# Patient Record
Sex: Female | Born: 1970 | Race: Black or African American | Hispanic: No | Marital: Single | Smoking: Never smoker
Health system: Southern US, Community
[De-identification: ages and names within clinical notes are randomized; demographics above are authoritative.]

## PROBLEM LIST (undated history)

## (undated) ENCOUNTER — Emergency Department: Admission: EM | Discharge status: Absent without leave | Payer: BC Managed Care – PPO

## (undated) DIAGNOSIS — I10 Essential (primary) hypertension: Secondary | ICD-10-CM

## (undated) DIAGNOSIS — R519 Headache, unspecified: Secondary | ICD-10-CM

## (undated) HISTORY — PX: SHOULDER ARTHROSCOPY DISTAL CLAVICLE EXCISION AND OPEN ROTATOR CUFF REPAIR: SHX2396

## (undated) HISTORY — PX: CHOLECYSTECTOMY: SHX55

---

## 2004-02-18 HISTORY — PX: TOTAL ABDOMINAL HYSTERECTOMY: SHX209

## 2011-05-17 ENCOUNTER — Ambulatory Visit: Payer: Self-pay | Admitting: Family Medicine

## 2012-04-01 ENCOUNTER — Emergency Department: Payer: Self-pay | Admitting: Emergency Medicine

## 2012-04-01 LAB — COMPREHENSIVE METABOLIC PANEL
Albumin: 3.4 g/dL (ref 3.4–5.0)
Anion Gap: 7 (ref 7–16)
BUN: 10 mg/dL (ref 7–18)
Bilirubin,Total: 0.5 mg/dL (ref 0.2–1.0)
Co2: 26 mmol/L (ref 21–32)
Creatinine: 0.83 mg/dL (ref 0.60–1.30)
EGFR (Non-African Amer.): >60
Glucose: 110 mg/dL — ABNORMAL HIGH (ref 65–99)
Osmolality: 272 (ref 275–301)
Potassium: 3.2 mmol/L — ABNORMAL LOW (ref 3.5–5.1)
SGOT(AST): 29 U/L (ref 15–37)
SGPT (ALT): 26 U/L (ref 12–78)
Total Protein: 7.7 g/dL (ref 6.4–8.2)

## 2012-04-01 LAB — CBC
MCHC: 33.6 g/dL (ref 32.0–36.0)
MCV: 86 fL (ref 80–100)
RBC: 4.29 10*6/uL (ref 3.80–5.20)
RDW: 13.7 % (ref 11.5–14.5)

## 2012-04-01 LAB — URINALYSIS, COMPLETE
Bilirubin,UR: NEGATIVE
Glucose,UR: NEGATIVE mg/dL (ref 0–75)
Ph: 5 (ref 4.5–8.0)
RBC,UR: 2 /HPF (ref 0–5)
Specific Gravity: 1.028 (ref 1.003–1.030)

## 2012-04-02 LAB — RAPID INFLUENZA A&B ANTIGENS

## 2012-04-04 LAB — BETA STREP CULTURE(ARMC)

## 2013-04-23 ENCOUNTER — Ambulatory Visit: Payer: Self-pay | Admitting: Family Medicine

## 2015-04-14 ENCOUNTER — Encounter: Payer: Self-pay | Admitting: Family Medicine

## 2015-04-14 ENCOUNTER — Ambulatory Visit (INDEPENDENT_AMBULATORY_CARE_PROVIDER_SITE_OTHER): Payer: Self-pay | Admitting: Family Medicine

## 2015-04-14 VITALS — BP 114/67 | HR 65 | Temp 98.5°F | Resp 16 | Ht 64.0 in | Wt 205.4 lb

## 2015-04-14 DIAGNOSIS — L309 Dermatitis, unspecified: Secondary | ICD-10-CM

## 2015-04-14 NOTE — Patient Instructions (Signed)
Cortaid or Cortizone-10 applied lightly twice a day.

## 2015-04-14 NOTE — Progress Notes (Signed)
Name: Caroline Wang   MRN: [id number]    DOB: 10/22/1970   Date:04/14/2015       Progress Note  Subjective  Chief Complaint  Chief Complaint  Patient presents with  . Rash    Pt noticed about a month ago, comes and goes, it is drying out with cracking and pain when she smiles    HPI Has a dry scaly place around mouth on and off for 1  Month.  Skin cracks and is only sore with smiling or eating.  No blisters or sores.  Just dry scaley skin.  No problem-specific assessment & plan notes found for this encounter.   History reviewed. No pertinent past medical history.  Social History  Substance Use Topics  . Smoking status: Never Smoker   . Smokeless tobacco: Not on file  . Alcohol Use: Not on file    No current outpatient prescriptions on file.  Not on File  Review of Systems  Constitutional: Negative for fever, chills, weight loss and malaise/fatigue.  HENT: Negative for hearing loss.   Eyes: Negative for blurred vision and double vision.  Respiratory: Negative for cough, shortness of breath and wheezing.   Cardiovascular: Negative for chest pain, palpitations and leg swelling.  Gastrointestinal: Negative for heartburn, abdominal pain and blood in stool.  Genitourinary: Negative for dysuria, urgency and frequency.  Skin: Positive for rash.  Neurological: Negative for weakness and headaches.      Objective  Filed Vitals:   04/14/15 1002  BP: 114/67  Pulse: 65  Temp: 98.5 F (36.9 C)  TempSrc: Oral  Resp: 16  Height: 5\' 4"  (1.626 m)  Weight: 205 lb 6.4 oz (93.169 kg)  SpO2: 100%     Physical Exam  Constitutional: She is well-developed, well-nourished, and in no distress. No distress.  Lymphadenopathy:    She has no cervical adenopathy.  Skin:  Slightly red raised scaly rash on upper and lower lips, L and R (L>R).  Does not innvolve lips themselves, just above and below them.  No blisters or cracked skin at this time.  Vitals reviewed.     No results  found for this or any previous visit (from the past 2160 hour(s)).   Assessment & Plan  1. Eczema -Use OTC Cortaid or Cortizone-10, applied lightly twice a day.  May use Vasoline to maintain moisture.

## 2015-09-14 ENCOUNTER — Other Ambulatory Visit: Payer: Self-pay | Admitting: Family Medicine

## 2015-09-15 ENCOUNTER — Other Ambulatory Visit: Payer: Self-pay | Admitting: Family Medicine

## 2016-04-07 ENCOUNTER — Ambulatory Visit (INDEPENDENT_AMBULATORY_CARE_PROVIDER_SITE_OTHER): Payer: BC Managed Care – PPO | Admitting: Family Medicine

## 2016-04-07 ENCOUNTER — Encounter: Payer: Self-pay | Admitting: Family Medicine

## 2016-04-07 VITALS — BP 136/82 | HR 98 | Temp 102.7°F | Resp 16 | Ht 64.0 in | Wt 220.0 lb

## 2016-04-07 DIAGNOSIS — R509 Fever, unspecified: Secondary | ICD-10-CM

## 2016-04-07 DIAGNOSIS — J111 Influenza due to unidentified influenza virus with other respiratory manifestations: Secondary | ICD-10-CM | POA: Diagnosis not present

## 2016-04-07 LAB — POCT INFLUENZA A/B
Influenza A, POC: NEGATIVE
Influenza B, POC: NEGATIVE

## 2016-04-07 MED ORDER — OSELTAMIVIR PHOSPHATE 75 MG PO CAPS: 75.0000 mg | ORAL_CAPSULE | Freq: Two times a day (BID) | ORAL | 0 refills | Status: DC

## 2016-04-07 MED ORDER — IPRATROPIUM BROMIDE 0.06 % NA SOLN: 2.0000 | Freq: Four times a day (QID) | NASAL | 0 refills | Status: DC

## 2016-04-07 MED ORDER — BENZONATATE 100 MG PO CAPS: 100.0000 mg | ORAL_CAPSULE | Freq: Three times a day (TID) | ORAL | 0 refills | Status: DC | PRN

## 2016-04-07 NOTE — Progress Notes (Signed)
Subjective:    Patient ID: Caroline Wang, female    DOB: June 21, 1970, 46 y.o.   MRN: [id number]  Caroline Wang is a 46 y.o. female presenting on 04/07/2016 for Fever (chills sore throat cough onset 4 days fever is 101.2 this morning)  Patient presents for a same day appointment.  HPI   INFLUENZA / Fever / Cough / URI Reports symptoms started 3 days at ago Monday night with significant headache, then developed fevers/chills, and felt general malaise, did not go to work, stayed at home and rested, drank fluids, tried to improve hydration. Then worsening over past 24-48 hours with worsening coughing, and some sore throat related to cough only, not sore with eating or drinking, cough is non productive. 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 Did not get flu vaccine this season. Has 2 children at home, ages 54 and 4, without symptoms currently. - Tried to take some OTC sinus medicine, then had rhinorrhea after, continues to take Mucinex-DM - Last dose of Tylenol last night 10pm - Admits now headache (frontal, still intermittent but eased up a lot, without any more sinus pressure - Admits fever (102.31F now, last night had 102.63F), has generalized muscle aches and fatigue - Denies any nausea, vomiting, abdominal pain, diarrhea  Social History  Substance Use Topics  . Smoking status: Never Smoker  . Smokeless tobacco: Never Used  . Alcohol use 0.0 oz/week    Review of Systems Per HPI unless specifically indicated above     Objective:    BP 136/82 (BP Location: Left Arm, Cuff Size: Normal)   Pulse 98   Temp (!) 102.7 F (39.3 C) (Oral)   Resp 16   Ht 5\' 4"  (1.626 m)   Wt 220 lb (99.8 kg)   SpO2 98%   BMI 37.76 kg/m   Wt Readings from Last 3 Encounters:  04/07/16 220 lb (99.8 kg)  04/14/15 205 lb 6.4 oz (93.2 kg)    Physical Exam  Constitutional: She is oriented to person, place, and time. She appears well-developed and  well-nourished. No distress.  Currently mildly ill appearing, mostly comfortable, cooperative  HENT:  Head: Normocephalic and atraumatic.  Mouth/Throat: Oropharynx is clear and moist.  Frontal sinuses mild tender. Maxillary sinuses non-tender. Nares patent with some congestion without purulence. Bilateral TMs clear without erythema or bulging, R TM mild clear effusion only. Oropharynx clear without erythema, exudates, edema or asymmetry.  Eyes: Conjunctivae are normal. Right eye exhibits no discharge. Left eye exhibits no discharge.  Neck: Normal range of motion. Neck supple.  Cardiovascular: Regular rhythm, normal heart sounds and intact distal pulses.   No murmur heard. Mild tachycardic  Pulmonary/Chest: Effort normal and breath sounds normal. No respiratory distress. She has no wheezes. She has no rales.  Speaks full sentences. Good air movement. Occasional cough.  Musculoskeletal: Normal range of motion. She exhibits no edema or tenderness.  Lymphadenopathy:    She has no cervical adenopathy.  Neurological: She is alert and oriented to person, place, and time.  Skin: Skin is warm and dry. No rash noted. She is not diaphoretic. No erythema.  Psychiatric: Her behavior is normal.  Nursing note and vitals reviewed.   I have personally reviewed the following lab results from 04/07/16.  Results for orders placed or performed in visit on 04/07/16  POCT Influenza A/B  Result Value Ref Range   Influenza A, POC Negative Negative   Influenza B, POC Negative  Negative      Assessment & Plan:   Problem List Items Addressed This Visit    None    Visit Diagnoses    Influenza    -  Primary  Clinically diagnosed influenza despite negative rapid flu test today, concern for flu given symptoms and actively febrile 102.49F currently - Duration x 3 days, without complication. Tolerating PO and well hydrated - No other focal findings of infection today - Did not receive influenza vaccine this  season  Plan: 1. Start Tamiflu 75mg  capsules BID x 5 days, also provided additional rx as precaution to use for one of patient's children if they develop symptoms, age 67-25, otherwise they can go to urgent care 2. Supportive care as advised with NSAID / Tylenol alternating PRN fever/myalgias, improve hydration, may take OTC Cold/Flu meds 3. Start Tessalon Perls take 1 capsule up to 3 times a day as needed for cough 4. Start Atrovent nasal spray decongestant 2 sprays in each nostril up to 4 times daily for 7 days 5. Return criteria given if significant worsening, consider post-influenza complications, otherwise follow-up if needed      Relevant Medications   benzonatate (TESSALON) 100 MG capsule   ipratropium (ATROVENT) 0.06 % nasal spray   oseltamivir (TAMIFLU) 75 MG capsule   Fever chills       Relevant Orders   POCT Influenza A/B (Completed)      Meds ordered this encounter  Medications  . benzonatate (TESSALON) 100 MG capsule    Sig: Take 1 capsule (100 mg total) by mouth 3 (three) times daily as needed for cough.    Dispense:  30 capsule    Refill:  0  . DISCONTD: oseltamivir (TAMIFLU) 75 MG capsule    Sig: Take 1 capsule (75 mg total) by mouth 2 (two) times daily. For 5 days    Dispense:  10 capsule    Refill:  0  . ipratropium (ATROVENT) 0.06 % nasal spray    Sig: Place 2 sprays into both nostrils 4 (four) times daily. For up to 5-7 days then stop.    Dispense:  15 mL    Refill:  0  . oseltamivir (TAMIFLU) 75 MG capsule    Sig: Take 1 capsule (75 mg total) by mouth 2 (two) times daily. For 5 days    Dispense:  10 capsule    Refill:  0    Follow up plan: Return in about 2 weeks (around 04/21/2016), or if symptoms worsen or fail to improve, for flu.  Saralyn PilarAlexander Saquan Furtick, DO [facility] 04/07/2016, 8:59 AM

## 2016-04-07 NOTE — Patient Instructions (Signed)
Thank you for coming in to clinic today.  Your flu test was NEGATIVE, this is not 100% though, and you can still have the flu with a negative test, otherwise it could be a different viral syndrome.  1. - Start Tamiflu (anti-flu medicine) take one capsule 75mg  twice a day for 5 days - I have printed one additional rx for a family member to start if they get symptoms start 1 capsule twice a day for 5 days - Wash hands and cover cough very well to avoid spread of infection - For symptom control:      - Take Ibuprofen / Advil 400-600mg  every 6-8 hours as needed for fever / muscle aches, and may also take Tylenol 500-1000mg  per dose every 6-8 hours or 3 times a day, can alternate dosing every 3 hours      - Start Tessalon perls one every 8 hours or 3 times a day as needed for cough      - Start Atrovent nasal spray decongestant 2 sprays in each nostril up to 4 times daily for 7 days      - Start OTC Mucinex-DM for cough and congestion for up to 7 days - Improve hydration with plenty of clear fluids  If significant worsening with poor fluid intake, worsening fever, difficulty breathing due to coughing, worsening body aches, weakness, or other more concerning symptoms difficulty breathing you can seek treatment at Emergency Department. Also if improved flu symptoms and then worsening days to week later with concerns for bronchitis, productive cough fever chills again we may need to check for possible pneumonia that can occur after the flu  Please schedule a follow-up appointment with Dr. Althea CharonKaramalegos in 1-2 weeks as needed if worsening from Flu / Bronchitis  If you have any other questions or concerns, please feel free to call the clinic or send a message through MyChart. You may also schedule an earlier appointment if necessary.  Saralyn PilarAlexander Amatullah Christy, DO [facility]

## 2016-06-10 ENCOUNTER — Ambulatory Visit (INDEPENDENT_AMBULATORY_CARE_PROVIDER_SITE_OTHER): Payer: BC Managed Care – PPO | Admitting: Family Medicine

## 2016-06-10 ENCOUNTER — Encounter: Payer: Self-pay | Admitting: Family Medicine

## 2016-06-10 VITALS — BP 131/55 | HR 67 | Temp 98.6°F | Resp 16 | Ht 64.0 in | Wt 218.0 lb

## 2016-06-10 DIAGNOSIS — J309 Allergic rhinitis, unspecified: Secondary | ICD-10-CM | POA: Diagnosis not present

## 2016-06-10 DIAGNOSIS — Z9109 Other allergy status, other than to drugs and biological substances: Secondary | ICD-10-CM

## 2016-06-10 MED ORDER — IPRATROPIUM BROMIDE 0.06 % NA SOLN: 2.0000 | Freq: Four times a day (QID) | NASAL | 0 refills | Status: DC

## 2016-06-10 NOTE — Progress Notes (Signed)
Subjective:    Patient ID: Caroline Wang, female    DOB: 08/22/1970, 46 y.o.   MRN: [id number]  Caroline Wang is a 46 y.o. 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 Has had some temporary relief, but usually refractory - Interested in referral to Allergist for testing and treatment - Has family history of allergies / sinus as well with mother - Denies any fevers/chills, sinus pain or pressure, ear pain or pressure, wheezing, dyspnea  Social History  Substance Use Topics  . Smoking status: Never Smoker  . Smokeless tobacco: Never Used  . Alcohol use 0.0 oz/week    Review of Systems Per HPI unless specifically indicated above     Objective:    BP (!) 131/55   Pulse 67   Temp 98.6 F (37 C) (Oral)   Resp 16   Ht 5\' 4"  (1.626 m)   Wt 218 lb (98.9 kg)   BMI 37.42 kg/m   Wt Readings from Last 3 Encounters:  06/10/16 218 lb (98.9 kg)  04/07/16 220 lb (99.8 kg)  04/14/15 205 lb 6.4 oz (93.2 kg)    Physical Exam  Constitutional: She appears well-developed and well-nourished. No distress.  Well-appearing, uncomfortable, cooperative  HENT:  Head: Normocephalic and atraumatic.  Mouth/Throat: Oropharynx is clear and moist.  Frontal / maxillary sinuses non-tender. Nares with significant bilateral R>L turbinate edema and inflammation with  some congestion without purulence. Bilateral TMs normal with mild clear effusion R>L without erythema or bulging. Oropharynx clear without erythema, exudates, edema or asymmetry.  Eyes: Conjunctivae are normal. Right eye exhibits no discharge. Left eye exhibits no discharge.  Neck: Normal range of motion. Neck supple.  Cardiovascular: Normal rate, regular rhythm, normal heart sounds and intact distal pulses.   No murmur heard. Pulmonary/Chest: Effort normal and breath sounds normal. No respiratory distress. She has no wheezes. She has no rales.  Lymphadenopathy:    She has no cervical adenopathy.  Neurological: She is alert.  Skin: Skin is warm and dry. No rash noted. She is not diaphoretic. No erythema.  Psychiatric: She has a normal mood and affect. Her behavior is normal.  Nursing note and vitals reviewed.        Assessment & Plan:   Problem List Items Addressed This Visit    Chronic allergic rhinitis - Primary    Acute on chronic allergic rhinitis with moderate to severe environmental allergies. Without evidence of active sinusitis or other infection - Refractory to most treatments (anti-histamines, nasal steroid, among others, singulair)  Plan: 1. Referral to [facility] - further eval, allergy testing, management, may need allergy shots in future 2. No antibiotics at this time, no sinusitis 3. Restart Atrovent nasal spray decongestant 2 sprays in each nostril up to 4 times daily for 7 days 4. May continue anti-histamine 5. Follow-up PRN worsening, - limited other options available at this time      Relevant Medications  ipratropium (ATROVENT) 0.06 % nasal spray   Other Relevant Orders   Ambulatory referral to Allergy    Other Visit Diagnoses    Environmental allergies       Likely underlying etiology, possible other exposures at work   Relevant Orders   Ambulatory referral to Allergy      Meds ordered this encounter  Medications  . ipratropium (ATROVENT) 0.06 %  nasal spray    Sig: Place 2 sprays into both nostrils 4 (four) times daily. For up to 5-7 days then stop.    Dispense:  15 mL    Refill:  0    Follow up plan: Return in about 4 weeks (around 07/08/2016), or if symptoms worsen or fail to improve, for allergy/sinus.  Saralyn Pilar, DO [facility] 06/10/2016, 10:56 PM

## 2016-06-10 NOTE — Assessment & Plan Note (Signed)
Acute on chronic allergic rhinitis with moderate to severe environmental allergies. Without evidence of active sinusitis or other infection - Refractory to most treatments (anti-histamines, nasal steroid, among others, singulair)  Plan: 1. Referral to [facility] - further eval, allergy testing, management, may need allergy shots in future 2. No antibiotics at this time, no sinusitis 3. Restart Atrovent nasal spray decongestant 2 sprays in each nostril up to 4 times daily for 7 days 4. May continue anti-histamine 5. Follow-up PRN worsening, - limited other options available at this time

## 2016-06-10 NOTE — Patient Instructions (Signed)
Thank you for coming to the clinic today.  1.  Placed referral, call them within 1-2 weeks if you have not heard back about apt yet  [facility] [address] [place], [place]  [id number] Phone: [phone]  Sent rx for the Atrovent again, nasal spray decongestant 2 sprays in each nostril up to 4 times daily for 7 days  STOP Afrin  Keep taking sudafed  May try OTC anti-histamine claritin, zyrtec, allegra - but keep in mind may need to come off of these for allergy testing  Please schedule a follow-up appointment with Dr. Althea CharonKaramalegos as needed for allergies / sinusitis, or if changes into infection, fevers, or worsening sinus / pain pressure  If you have any other questions or concerns, please feel free to call the clinic or send a message through MyChart. You may also schedule an earlier appointment if necessary.  Saralyn PilarAlexander Lysa Livengood, DO [facility]

## 2017-05-02 ENCOUNTER — Other Ambulatory Visit: Payer: Self-pay

## 2017-05-02 ENCOUNTER — Encounter: Payer: Self-pay | Admitting: Family Medicine

## 2017-05-02 ENCOUNTER — Ambulatory Visit (INDEPENDENT_AMBULATORY_CARE_PROVIDER_SITE_OTHER): Payer: BC Managed Care – PPO | Admitting: Family Medicine

## 2017-05-02 DIAGNOSIS — J309 Allergic rhinitis, unspecified: Secondary | ICD-10-CM

## 2017-05-02 MED ORDER — FEXOFENADINE HCL 180 MG PO TABS: 180.0000 mg | ORAL_TABLET | Freq: Every day | ORAL | 5 refills | Status: DC

## 2017-05-02 MED ORDER — IPRATROPIUM BROMIDE 0.06 % NA SOLN: 2.0000 | Freq: Four times a day (QID) | NASAL | 0 refills | Status: DC

## 2017-05-02 MED ORDER — MONTELUKAST SODIUM 10 MG PO TABS: 10.0000 mg | ORAL_TABLET | Freq: Every day | ORAL | 1 refills | Status: DC

## 2017-05-02 NOTE — Patient Instructions (Addendum)
Thank you for coming to the office today.  1. It sounds like you have persistent Sinus Congestion or "Rhinosinusitis" - I do not think that this is a Bacterial Sinus Infection. Usually these are caused by Viruses or Allergies, and will run it's course in about 7 to 10 days. - No antibiotics are needed - Resume Allegra 180mg  daily  - Start Atrovent nasal spray decongestant 2 sprays in each nostril up to 4 times daily for 7 days  - Start Singulair 10mg  nightly  - Recommend to keep using Nasal Saline (Simply saline) spray multiple times a day to help flush out congestion and clear sinuses  Do not use Afrin  Lavage system or netti pot is fine to use  - Improve hydration by drinking plenty of clear fluids (water, gatorade) to reduce secretions and thin congestion - Congestion draining down throat can cause irritation. May try warm herbal tea with honey, cough drops - Can take Tylenol or Ibuprofen as needed for fevers - May continue over the counter cold medicine as you are, I would not use any decongestant or mucinex longer than 7 days.  If not improving by Friday call or send message and we can send in antibiotic if worse  If you develop persistent fever >101F for at least 3 consecutive days, headaches with sinus pain or pressure or persistent earache, please schedule a follow-up evaluation within next few days to week.  DUE for FASTING BLOOD WORK (no food or drink after midnight before the lab appointment, only water or coffee without cream/sugar on the morning of)  SCHEDULE "Lab Only" visit in the morning at the clinic for lab draw in 3-6 MONTHS   - Make sure Lab Only appointment is at about 1 week before your next appointment, so that results will be available  For Lab Results, once available within 2-3 days of blood draw, you can can log in to MyChart online to view your results and a brief explanation. Also, we can discuss results at next follow-up visit.   Please schedule a  Follow-up Appointment to: Return in about 4 months (around 09/01/2017), or if symptoms worsen or fail to improve, for fasting lab only in 4 months then 1 week later Annual + Pap.  If you have any other questions or concerns, please feel free to call the office or send a message through MyChart. You may also schedule an earlier appointment if necessary.  Additionally, you may be receiving a survey about your experience at our office within a few days to 1 week by e-mail or mail. We value your feedback.  Saralyn PilarAlexander Delwin Raczkowski, DO [facility]

## 2017-05-02 NOTE — Progress Notes (Signed)
Subjective:    Patient ID: Caroline Wang, female    DOB: 08/08/1970, 47 y.o.   MRN: [id number]  Caroline Amenamela R Quezada is a 47 y.o. female presenting on 05/02/2017 for Cough (worsening over 3 days)  Patient presents for a same day appointment.  HPI   Acute URI / Allergic Rhinosinusitis Reports symptoms started 3 days ago with rhinorrhea and sinus congestion, then seemed to progress with persistent worsening congestion and sinus symptoms. Seems similar to prior allergy flare ups. Lastseen by me for similar issue 05/2016 with prior rx and referral to Allergist, they offered allergy shots but she declined due to frequency of vaccines and uncertain results, and not interested in vaccines - She took Xyzal (allergy med) and OTC generic phenylephrine decongestant - Now today got elevated temp to 100.44F today at 2pm - Previously tried flonase, anti histamine nasal spray, atrovent, uncertain exact results - Denies chills chest pain dyspnea wheezing headache rash ear pain   Health Maintenance: Due for pap smear, last done >3 years ago, unsure date. She will schedule for annual, see below  [facility] 05/02/2017  Decreased Interest 0  Down, Depressed, Hopeless 0  PHQ - 2 Score 0  Altered sleeping 0  Tired, decreased energy 0  Change in appetite 0  Feeling bad or failure about yourself  0  Trouble concentrating 0  Moving slowly or fidgety/restless 0  Suicidal thoughts 0  PHQ-9 Score 0  Difficult doing work/chores Not difficult at all    Social History   Tobacco Use  . Smoking status: Never Smoker  . Smokeless tobacco: Never Used  Substance Use Topics  . Alcohol use: Yes    Alcohol/week: 0.0 oz  . Drug use: No    Review of Systems Per HPI unless specifically indicated above     Objective:    BP 135/79 (BP Location: Left Arm, Patient Position: Sitting, Cuff Size: Normal)   Pulse 95   Temp 100.1 F (37.8 C)   Resp 16   Ht 5\' 4"  (1.626 m)   Wt 216 lb (98 kg)   BMI  37.08 kg/m   Wt Readings from Last 3 Encounters:  05/02/17 216 lb (98 kg)  06/10/16 218 lb (98.9 kg)  04/07/16 220 lb (99.8 kg)    Physical Exam  Constitutional: She is oriented to person, place, and time. She appears well-developed and well-nourished. No distress.  Tired appearing, comfortable, cooperative, obese  HENT:  Head: Normocephalic and atraumatic.  Mouth/Throat: Oropharynx is clear and moist.  Frontal / maxillary sinuses non-tender. Nares with turbinate edema extensive without purulence. R TM with notable clear effusion and fullness, without purulence or erythema or bulging. L TM clear without erythema, effusion or bulging. Oropharynx with mild posterior pharyngeal drainage without erythema, exudates, edema or asymmetry.  Eyes: Conjunctivae are normal. Right eye exhibits no discharge. Left eye exhibits no discharge.  Neck: Normal range of motion. Neck supple.  Cardiovascular: Normal rate, regular rhythm, normal heart sounds and intact distal pulses.  No murmur heard. Pulmonary/Chest: Effort normal and breath sounds normal. No respiratory distress. She has no wheezes. She has no rales.  Musculoskeletal: Normal range of motion. She exhibits no edema.  Lymphadenopathy:    She has no cervical adenopathy.  Neurological: She is alert and oriented to person, place, and time.  Skin: Skin is warm and dry. No rash noted. She is not diaphoretic. No erythema.  Psychiatric: Her behavior is normal.  Well groomed, good eye contact, normal speech  and thoughts  Nursing note and vitals reviewed.      Assessment & Plan:   Problem List Items Addressed This Visit    Chronic allergic rhinitis    Acute on chronic allergic rhinitis with moderate to severe environmental allergies. Without evidence of active sinusitis or other infection - Refractory to most treatments in past (anti-histamines, nasal steroid, among others, singulair) Already seen [facility] [place] [facility] but they have nothing else to  offer since she has declined allergy shots  Plan: 1. Discussion on limited options - agree to repeat trial of allergy meds to see if helps some, recommend that she should start medicines in advance of allergy season instead of waiting until symptoms worse - Start anti histamine Allegra 180mg  daily AM, new rx Singulair 10mg  nightly - Start Atrovent nasal spray decongestant 2 sprays in each nostril up to 4 times daily for 7 days, short term relief, she is not as interested in flonase or antihistamine spray - Use nasal saline - No antibiotics at this time, no sinusitis if not improved by 3 days or by next week can contact office consider augmentin Follow-up PRN worsening, - limited other options available at this time      Relevant Medications   fexofenadine (ALLEGRA) 180 MG tablet   montelukast (SINGULAIR) 10 MG tablet   ipratropium (ATROVENT) 0.06 % nasal spray        Meds ordered this encounter  Medications  . fexofenadine (ALLEGRA) 180 MG tablet    Sig: Take 1 tablet (180 mg total) by mouth daily.    Dispense:  30 tablet    Refill:  5  . montelukast (SINGULAIR) 10 MG tablet    Sig: Take 1 tablet (10 mg total) by mouth at bedtime.    Dispense:  90 tablet    Refill:  1  . ipratropium (ATROVENT) 0.06 % nasal spray    Sig: Place 2 sprays into both nostrils 4 (four) times daily. For up to 5-7 days then stop.    Dispense:  15 mL    Refill:  0      Follow up plan: Return in about 4 months (around 09/01/2017), or if symptoms worsen or fail to improve, for fasting lab only in 4 months then 1 week later Annual + Pap.  Future labs ordered for 08/24/17  Saralyn Pilar, DO [facility] 05/03/2017, 1:39 AM

## 2017-05-03 ENCOUNTER — Other Ambulatory Visit: Payer: Self-pay | Admitting: Family Medicine

## 2017-05-03 DIAGNOSIS — Z0000 Encounter for general adult medical examination without abnormal findings: Secondary | ICD-10-CM

## 2017-05-03 DIAGNOSIS — Z131 Encounter for screening for diabetes mellitus: Secondary | ICD-10-CM

## 2017-05-03 DIAGNOSIS — E669 Obesity, unspecified: Secondary | ICD-10-CM | POA: Insufficient documentation

## 2017-05-03 DIAGNOSIS — Z13220 Encounter for screening for lipoid disorders: Secondary | ICD-10-CM

## 2017-05-03 NOTE — Assessment & Plan Note (Signed)
Acute on chronic allergic rhinitis with moderate to severe environmental allergies. Without evidence of active sinusitis or other infection - Refractory to most treatments in past (anti-histamines, nasal steroid, among others, singulair) Already seen [facility] but they have nothing else to offer since she has declined allergy shots  Plan: 1. Discussion on limited options - agree to repeat trial of allergy meds to see if helps some, recommend that she should start medicines in advance of allergy season instead of waiting until symptoms worse - Start anti histamine Allegra 180mg  daily AM, new rx Singulair 10mg  nightly - Start Atrovent nasal spray decongestant 2 sprays in each nostril up to 4 times daily for 7 days, short term relief, she is not as interested in flonase or antihistamine spray - Use nasal saline - No antibiotics at this time, no sinusitis if not improved by 3 days or by next week can contact office consider augmentin Follow-up PRN worsening, - limited other options available at this time

## 2017-06-26 LAB — RPR: RPR: NONREACTIVE

## 2017-06-26 LAB — HM HIV SCREENING LAB: HM HIV Screening: NEGATIVE

## 2017-06-26 LAB — HM HEPATITIS C SCREENING LAB: HM Hepatitis Screen: NEGATIVE

## 2017-06-30 ENCOUNTER — Other Ambulatory Visit: Payer: Self-pay | Admitting: Family Medicine

## 2017-06-30 DIAGNOSIS — Z1231 Encounter for screening mammogram for malignant neoplasm of breast: Secondary | ICD-10-CM

## 2017-07-05 ENCOUNTER — Ambulatory Visit
Admission: RE | Admit: 2017-07-05 | Discharge: 2017-07-05 | Disposition: A | Discharge status: Home / Self Care | Payer: BC Managed Care – PPO | Source: Ambulatory Visit | Attending: Family Medicine | Admitting: Family Medicine

## 2017-07-05 ENCOUNTER — Encounter: Payer: Self-pay | Admitting: Radiology

## 2017-07-05 DIAGNOSIS — Z1231 Encounter for screening mammogram for malignant neoplasm of breast: Secondary | ICD-10-CM

## 2017-07-13 ENCOUNTER — Encounter: Payer: Self-pay | Admitting: Family Medicine

## 2017-08-24 ENCOUNTER — Other Ambulatory Visit: Payer: BC Managed Care – PPO

## 2017-08-24 DIAGNOSIS — Z13220 Encounter for screening for lipoid disorders: Secondary | ICD-10-CM

## 2017-08-24 DIAGNOSIS — Z0000 Encounter for general adult medical examination without abnormal findings: Secondary | ICD-10-CM

## 2017-08-24 DIAGNOSIS — E669 Obesity, unspecified: Secondary | ICD-10-CM

## 2017-08-24 DIAGNOSIS — Z131 Encounter for screening for diabetes mellitus: Secondary | ICD-10-CM

## 2017-08-24 LAB — COMPLETE METABOLIC PANEL WITH GFR
AG RATIO: 1.5 (calc) (ref 1.0–2.5)
ALBUMIN MSPROF: 4.1 g/dL (ref 3.6–5.1)
ALT: 9 U/L (ref 6–29)
AST: 16 U/L (ref 10–35)
Alkaline phosphatase (APISO): 66 U/L (ref 33–115)
BUN: 12 mg/dL (ref 7–25)
CALCIUM: 9.1 mg/dL (ref 8.6–10.2)
CO2: 26 mmol/L (ref 20–32)
Chloride: 103 mmol/L (ref 98–110)
Creat: 0.81 mg/dL (ref 0.50–1.10)
GFR, EST NON AFRICAN AMERICAN: 87 mL/min/{1.73_m2} (ref >=60)
GFR, Est African American: 101 mL/min/{1.73_m2} (ref >=60)
GLOBULIN: 2.8 g/dL (ref 1.9–3.7)
Glucose, Bld: 100 mg/dL — ABNORMAL HIGH (ref 65–99)
POTASSIUM: 4.1 mmol/L (ref 3.5–5.3)
SODIUM: 138 mmol/L (ref 135–146)
Total Bilirubin: 0.4 mg/dL (ref 0.2–1.2)
Total Protein: 6.9 g/dL (ref 6.1–8.1)

## 2017-08-24 LAB — CBC WITH DIFFERENTIAL/PLATELET
Basophils Absolute: 52 cells/uL (ref 0–200)
Basophils Relative: 0.7 %
EOS ABS: 192 {cells}/uL (ref 15–500)
Eosinophils Relative: 2.6 %
HCT: 36.5 % (ref 35.0–45.0)
Hemoglobin: 12 g/dL (ref 11.7–15.5)
Lymphs Abs: 2546 cells/uL (ref 850–3900)
MCH: 29.2 pg (ref 27.0–33.0)
MCHC: 32.9 g/dL (ref 32.0–36.0)
MCV: 88.8 fL (ref 80.0–100.0)
MONOS PCT: 8 %
MPV: 10.1 fL (ref 7.5–12.5)
NEUTROS PCT: 54.3 %
Neutro Abs: 4018 cells/uL (ref 1500–7800)
PLATELETS: 393 10*3/uL (ref 140–400)
RBC: 4.11 10*6/uL (ref 3.80–5.10)
RDW: 12.8 % (ref 11.0–15.0)
TOTAL LYMPHOCYTE: 34.4 %
WBC: 7.4 10*3/uL (ref 3.8–10.8)
WBCMIX: 592 {cells}/uL (ref 200–950)

## 2017-08-24 LAB — LIPID PANEL
Cholesterol: 146 mg/dL (ref <200)
HDL: 70 mg/dL (ref >50)
LDL Cholesterol (Calc): 62 mg/dL (calc)
Non-HDL Cholesterol (Calc): 76 mg/dL (calc) (ref <130)
Total CHOL/HDL Ratio: 2.1 (calc) (ref <5.0)
Triglycerides: 61 mg/dL (ref <150)

## 2017-08-24 LAB — HEMOGLOBIN A1C
HEMOGLOBIN A1C: 5.7 %{Hb} — AB (ref <5.7)
Mean Plasma Glucose: 117 (calc)
eAG (mmol/L): 6.5 (calc)

## 2017-08-30 ENCOUNTER — Encounter: Payer: Self-pay | Admitting: Family Medicine

## 2017-08-30 ENCOUNTER — Ambulatory Visit (INDEPENDENT_AMBULATORY_CARE_PROVIDER_SITE_OTHER): Payer: BC Managed Care – PPO | Admitting: Family Medicine

## 2017-08-30 VITALS — BP 116/68 | HR 60 | Temp 98.8°F | Resp 16 | Ht 64.0 in | Wt 212.0 lb

## 2017-08-30 DIAGNOSIS — B372 Candidiasis of skin and nail: Secondary | ICD-10-CM

## 2017-08-30 DIAGNOSIS — Z0000 Encounter for general adult medical examination without abnormal findings: Secondary | ICD-10-CM | POA: Diagnosis not present

## 2017-08-30 DIAGNOSIS — IMO0002 Reserved for concepts with insufficient information to code with codable children: Secondary | ICD-10-CM | POA: Insufficient documentation

## 2017-08-30 DIAGNOSIS — R7309 Other abnormal glucose: Secondary | ICD-10-CM | POA: Diagnosis not present

## 2017-08-30 DIAGNOSIS — G43709 Chronic migraine without aura, not intractable, without status migrainosus: Secondary | ICD-10-CM | POA: Insufficient documentation

## 2017-08-30 DIAGNOSIS — E669 Obesity, unspecified: Secondary | ICD-10-CM

## 2017-08-30 DIAGNOSIS — J309 Allergic rhinitis, unspecified: Secondary | ICD-10-CM | POA: Diagnosis not present

## 2017-08-30 MED ORDER — CLOTRIMAZOLE-BETAMETHASONE 1-0.05 % EX CREA: TOPICAL_CREAM | CUTANEOUS | 0 refills | Status: DC

## 2017-08-30 NOTE — Assessment & Plan Note (Signed)
Stable without active flare, still has persistent symptoms, limited improve on Singulair Followed by [facility] in past May reuse Atrovent PRN use in future Return if sinusitis

## 2017-08-30 NOTE — Assessment & Plan Note (Signed)
Weight down 4 lbs in 4 months Encourage to improve regular diet as discussed for elevated A1c. Keep improving regular exercise Goal weight loss progress

## 2017-08-30 NOTE — Progress Notes (Signed)
Subjective:    Patient ID: Caroline Wang, female    DOB: 03/03/1970, 47 y.o.   MRN: [id number]  Caroline Wang is a 47 y.o. female presenting on 08/30/2017 for Annual Exam   HPI   Here for Annual Physical and Lab Review  Elevated A1c / Obesity BMI >36 Recent labs show normal Lipids. - No prior history of A1c or PreDM. She does not have significant fam history of DM Meds: never on med before Lifestyle: - Weight down 4 lbs in 4 months - Diet (Tries to limit fried, not following a particular diet, sometimes missing breakfast due to time, usually 2 meals a day) - Exercise (3-4 x weekly about 1-2 hours a day, mostly zumba classes with cardio and whole body exercise, does not do strength training) Denies hypoglycemia  Chronic Migraines Reports long term problem for past 25 years. She has been treated in past with Wellbutrin, Topamax (side effects), off all meds for while now just takes Excedrin migraine. Frequency has 1-2 x migraine monthly. Usually excedrin helps if takes before onset. Admits photosensitivity, no nausea or vomiting or other complicated symptoms. Unsure trigger.  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 Improved temporarily on Atrovent only used briefly can re-use again.  Left breast under skin / Intertrigo - Reports recent problem over past 1-2 weeks, with irritated skin under breast, has been red and irritated episodic, itchy symptoms. Not used treatment, worse with warm weather sweat and moisture.  Health Maintenance:  Due for Flu Shot - declines today despite discussion on benefit.  Cervical Cancer Screening: S/p hysterectomy approx 10 years ago, uncertain if total and if has cervix still, had prior pap smear by last PCP Dr Juanetta GoslingHawkins several years ago, she was told "did not need another pap smear" after her surgery. Will check records. No prior abnormal result.  Breast  CA Screening: UTD. Last mammogram result negative bi-rads 1 (07/05/17) [facility]. No prior history abnormal mammogram. No known family history of breast cancer. Currently asymptomatic.  Colon CA Screening: Never had colonoscopy. Now at age 47 not due for early screening but could be eligible based on guidelines for age 59>45. No known family history of colon CA.   [facility] 05/02/2017  Decreased Interest 0  Down, Depressed, Hopeless 0  PHQ - 2 Score 0  Altered sleeping 0  Tired, decreased energy 0  Change in appetite 0  Feeling bad or failure about yourself  0  Trouble concentrating 0  Moving slowly or fidgety/restless 0  Suicidal thoughts 0  PHQ-9 Score 0  Difficult doing work/chores Not difficult at all    History reviewed. No pertinent past medical history. Past Surgical History:  Procedure Laterality Date  . TOTAL ABDOMINAL HYSTERECTOMY  02/2004   Total Abdominal including ovaries, cervix [facility] Dr Talbert CageWhite Jr   Social History   Socioeconomic History  . Marital status: Single    Spouse name: Not on file  . Number of children: 2  . Years of education: Not on file  . Highest education level: Not on file  Occupational History  . Not on file  Social Needs  . Financial resource strain: Not on file  . Food insecurity:    Worry: Not on file    Inability: Not on file  . Transportation needs:    Medical: Not on file    Non-medical: Not on file  Tobacco Use  . Smoking status: Never Smoker  . Smokeless tobacco: Never Used  Substance and Sexual Activity  . Alcohol use: Yes    Alcohol/week: 0.0 standard drinks  . Drug use: No  . Sexual activity: Not on file  Lifestyle  . Physical activity:    Days per week: Not on file    Minutes per session: Not on file  . Stress: Not on file  Relationships  . Social connections:    Talks on phone: Not on file    Gets together: Not on file    Attends religious service: Not on file      Active member of club or organization: Not on file    Attends meetings of clubs or organizations: Not on file    Relationship status: Not on file  . Intimate partner violence:    Fear of current or ex partner: Not on file    Emotionally abused: Not on file    Physically abused: Not on file    Forced sexual activity: Not on file  Other Topics Concern  . Not on file  Social History Narrative  . Not on file   Family History  Problem Relation Age of Onset  . Other Maternal Aunt 1       Pre-Diabetes  . Breast cancer Neg Hx   . Diabetes Neg Hx   . Colon cancer Neg Hx    Current Outpatient Medications on File Prior to Visit  Medication Sig  . fexofenadine (ALLEGRA) 180 MG tablet Take 1 tablet (180 mg total) by mouth daily.  Marland Kitchen ipratropium (ATROVENT) 0.06 % nasal spray Place 2 sprays into both nostrils 4 (four) times daily. For up to 5-7 days then stop.  . montelukast (SINGULAIR) 10 MG tablet Take 1 tablet (10 mg total) by mouth at bedtime.   No current facility-administered medications on file prior to visit.     Review of Systems  Constitutional: Negative for activity change, appetite change, chills, diaphoresis, fatigue and fever.  HENT: Positive for congestion. Negative for hearing loss.   Eyes: Negative for visual disturbance.  Respiratory: Negative for apnea, cough, choking, chest tightness, shortness of breath and wheezing.   Cardiovascular: Negative for chest pain, palpitations and leg swelling.  Gastrointestinal: Negative for abdominal pain, anal bleeding, blood in stool, constipation, diarrhea, nausea and vomiting.  Endocrine: Negative for cold intolerance.  Genitourinary: Negative for decreased urine volume, difficulty urinating, dysuria, frequency, hematuria, vaginal bleeding, vaginal discharge and vaginal pain.  Musculoskeletal: Negative for arthralgias, back pain and neck pain.  Skin: Positive for rash (under left breast).  Allergic/Immunologic: Negative for  environmental allergies.  Neurological: Negative for dizziness, weakness, light-headedness, numbness and headaches.  Hematological: Negative for adenopathy.  Psychiatric/Behavioral: Negative for behavioral problems, dysphoric mood and sleep disturbance. The patient is not nervous/anxious.    Per HPI unless specifically indicated above     Objective:    BP 116/68   Pulse 60   Temp 98.8 F (37.1 C) (Oral)   Resp 16   Ht 5\' 4"  (1.626 m)   Wt 212 lb (96.2 kg)   BMI 36.39 kg/m   Wt Readings from Last 3 Encounters:  08/30/17 212 lb (96.2 kg)  05/02/17 216 lb (98 kg)  06/10/16 218 lb (98.9 kg)    Physical Exam  Constitutional: She is oriented to person, place, and time. She appears well-developed and well-nourished. No distress.  Well-appearing, comfortable, cooperative, obese  HENT:  Head: Normocephalic and atraumatic.  Mouth/Throat: Oropharynx is  clear and moist.  Frontal / maxillary sinuses non-tender. Nares patent without purulence or edema. Bilateral TMs clear without erythema or bulging, mild clear effusion R > L. Oropharynx clear without erythema, exudates, edema or asymmetry.  Eyes: Pupils are equal, round, and reactive to light. Conjunctivae and EOM are normal. Right eye exhibits no discharge. Left eye exhibits no discharge.  Neck: Normal range of motion. Neck supple. No thyromegaly present.  Cardiovascular: Normal rate, regular rhythm, normal heart sounds and intact distal pulses.  No murmur heard. Pulmonary/Chest: Effort normal and breath sounds normal. No respiratory distress. She has no wheezes. She has no rales.  Abdominal: Soft. Bowel sounds are normal. She exhibits no distension and no mass. There is no tenderness.  Musculoskeletal: Normal range of motion. She exhibits no edema or tenderness.  Upper / Lower Extremities: - Normal muscle tone, strength bilateral upper extremities 5/5, lower extremities 5/5  Lymphadenopathy:    She has no cervical adenopathy.    Neurological: She is alert and oriented to person, place, and time.  Distal sensation intact to light touch all extremities  Skin: Skin is warm and dry. Rash (Under breast tissue Left side within skin fold slightly darker pigmented raised rash small to moderate area 2-3 cm, no ulceration, no extension. Her breast tissue remained covered during exam.) noted. She is not diaphoretic. No erythema.  Psychiatric: She has a normal mood and affect. Her behavior is normal.  Well groomed, good eye contact, normal speech and thoughts  Nursing note and vitals reviewed.  Results for orders placed or performed in visit on 08/24/17  Lipid panel  Result Value Ref Range   Cholesterol 146 <200 mg/dL   HDL 70 >16>50 mg/dL   Triglycerides 61 <109<150 mg/dL   LDL Cholesterol (Calc) 62 mg/dL (calc)   Total CHOL/HDL Ratio 2.1 <5.0 (calc)   Non-HDL Cholesterol (Calc) 76 <604<130 mg/dL (calc)  COMPLETE METABOLIC PANEL WITH GFR  Result Value Ref Range   Glucose, Bld 100 (H) 65 - 99 mg/dL   BUN 12 7 - 25 mg/dL   Creat 5.400.81 9.810.50 - 1.911.10 mg/dL   GFR, Est Non African American 87 > OR = 60 mL/min/1.3173m2   GFR, Est African American 101 > OR = 60 mL/min/1.6773m2   BUN/Creatinine Ratio NOT APPLICABLE 6 - 22 (calc)   Sodium 138 135 - 146 mmol/L   Potassium 4.1 3.5 - 5.3 mmol/L   Chloride 103 98 - 110 mmol/L   CO2 26 20 - 32 mmol/L   Calcium 9.1 8.6 - 10.2 mg/dL   Total Protein 6.9 6.1 - 8.1 g/dL   Albumin 4.1 3.6 - 5.1 g/dL   Globulin 2.8 1.9 - 3.7 g/dL (calc)   AG Ratio 1.5 1.0 - 2.5 (calc)   Total Bilirubin 0.4 0.2 - 1.2 mg/dL   Alkaline phosphatase (APISO) 66 33 - 115 U/L   AST 16 10 - 35 U/L   ALT 9 6 - 29 U/L  CBC with Differential/Platelet  Result Value Ref Range   WBC 7.4 3.8 - 10.8 Thousand/uL   RBC 4.11 3.80 - 5.10 Million/uL   Hemoglobin 12.0 11.7 - 15.5 g/dL   HCT 47.836.5 29.535.0 - 62.145.0 %   MCV 88.8 80.0 - 100.0 fL   MCH 29.2 27.0 - 33.0 pg   MCHC 32.9 32.0 - 36.0 g/dL   RDW 30.812.8 65.711.0 - 84.615.0 %   Platelets 393  140 - 400 Thousand/uL   MPV 10.1 7.5 - 12.5 fL   Neutro Abs 4,018  1,500 - 7,800 cells/uL   Lymphs Abs 2,546 850 - 3,900 cells/uL   WBC mixed population 592 200 - 950 cells/uL   Eosinophils Absolute 192 15 - 500 cells/uL   Basophils Absolute 52 0 - 200 cells/uL   Neutrophils Relative % 54.3 %   Total Lymphocyte 34.4 %   Monocytes Relative 8.0 %   Eosinophils Relative 2.6 %   Basophils Relative 0.7 %  Hemoglobin A1c  Result Value Ref Range   Hgb A1c MFr Bld 5.7 (H) <5.7 % of total Hgb   Mean Plasma Glucose 117 (calc)   eAG (mmol/L) 6.5 (calc)      Assessment & Plan:   Problem List Items Addressed This Visit    Chronic allergic rhinitis    Stable without active flare, still has persistent symptoms, limited improve on Singulair Followed by [facility] in past May reuse Atrovent PRN use in future Return if sinusitis      Chronic migraine    Stable chronic migraines, none actively Frequency 1-2 x month Improved on abortive Excedrin Migraine Failed: Topamax, Wellbutrin, Midrine?  Plan Discussion on routine migraine prevention and treatment Future may add abortive triptan if need Recommend reconsider preventative options - propranolol, venlafaxine, nortriptyline - future consider injectable CGRP such as Aimovig, Ajovy, Emgality Avoid triggers including foods, caffeine. Important to rest. Start headache diary, handout given, identify triggers for avoidance, bring to next visit Return criteria given for acute migraine, when to go to office vs ED      Elevated hemoglobin A1c    Clinically with elevated A1c in setting of risk factor obesity - not quite dx of PreDM at this time No fam history of DM  Plan:  1. Not on any therapy currently  2. Encourage improved lifestyle - low carb, low sugar diet, reduce portion size, continue regular exercise - glycemic diet handout given 3. Follow-up 6 months A1c in office for trend       Obesity (BMI 35.0-39.9 without comorbidity)     Weight down 4 lbs in 4 months Encourage to improve regular diet as discussed for elevated A1c. Keep improving regular exercise Goal weight loss progress       Other Visit Diagnoses    Annual physical exam    -  Primary  Updated Health Maintenance information - Discussion on pap smear today - not indicated after review of prior GYN surgical record of [facility], already had post-op pap smear x 2 that were negative, she does not have cervix by her report and by op report. - Continue yearly mammogram - Offered earlier colon cancer screen age 42-50, she will opt to start at age 84 unless new concern Reviewed recent lab results with patient Encouraged improvement to lifestyle with diet and exercise - Goal of weight loss     Candidal intertrigo     Acute flare x 1-2 week, under Lbreast skin folds, mild appearing candidal intertrigo characteristic on exam. due to excessive sweating / moisture.  Plan: 1. Start Lotrisone cream (anti fungal and steroid) to treat BID x 1-2 weeks or until resolve - precautions given in future if need repeat can use only topical anti fungal or nystatin powder if need 2. Keep dry, open to air some, limit bra, avoid excessive sweating Follow-up if not improve     Relevant Medications   clotrimazole-betamethasone (LOTRISONE) cream      Meds ordered this encounter  Medications  . clotrimazole-betamethasone (LOTRISONE) cream    Sig: Apply twice a day for  2 weeks to affected skin under breast or until rash healed    Dispense:  30 g    Refill:  0    Follow up plan: Return in about 6 months (around 03/02/2018) for Elevated A1c (check POC).  Saralyn Pilar, DO [facility] 08/30/2017, 10:26 AM

## 2017-08-30 NOTE — Assessment & Plan Note (Signed)
Clinically with elevated A1c in setting of risk factor obesity - not quite dx of PreDM at this time No fam history of DM  Plan:  1. Not on any therapy currently  2. Encourage improved lifestyle - low carb, low sugar diet, reduce portion size, continue regular exercise - glycemic diet handout given 3. Follow-up 6 months A1c in office for trend

## 2017-08-30 NOTE — Patient Instructions (Addendum)
Thank you for coming to the office today.  You do not need a pap smear - after review of surgical record - you had a Total Abdominal Hysterectomy (02/2004)  If interested in flu shot can return.  Elevated A1c to 5.7 - concern for slightly elevated blood sugars on average. Goal is < 5.7  Try to improve lower carb options if possible, stay active.  Next time in 6 months - fingerstick for A1c again, can be fasting or non fasting - (3 month average)  In future - if interested to return for focused migraine discussion we can review other preventative meds and reconsider - Aimovig, Ajovy, Emgality  Mammogram yearly 06/2018  Consider colon cancer screening within next 4 years or age 47+  Please schedule a Follow-up Appointment to: Return in about 6 months (around 03/02/2018) for Elevated A1c (check POC).  If you have any other questions or concerns, please feel free to call the office or send a message through MyChart. You may also schedule an earlier appointment if necessary.  Additionally, you may be receiving a survey about your experience at our office within a few days to 1 week by e-mail or mail. We value your feedback.  Saralyn PilarAlexander Jenita Rayfield, DO [facility]

## 2017-08-30 NOTE — Assessment & Plan Note (Signed)
Stable chronic migraines, none actively Frequency 1-2 x month Improved on abortive Excedrin Migraine Failed: Topamax, Wellbutrin, Midrine?  Plan Discussion on routine migraine prevention and treatment Future may add abortive triptan if need Recommend reconsider preventative options - propranolol, venlafaxine, nortriptyline - future consider injectable CGRP such as Aimovig, Ajovy, Emgality Avoid triggers including foods, caffeine. Important to rest. Start headache diary, handout given, identify triggers for avoidance, bring to next visit Return criteria given for acute migraine, when to go to office vs ED

## 2018-03-02 ENCOUNTER — Ambulatory Visit: Payer: BC Managed Care – PPO | Admitting: Family Medicine

## 2018-03-08 ENCOUNTER — Other Ambulatory Visit: Payer: Self-pay | Admitting: Family Medicine

## 2018-03-08 ENCOUNTER — Encounter: Payer: Self-pay | Admitting: Family Medicine

## 2018-03-08 ENCOUNTER — Ambulatory Visit (INDEPENDENT_AMBULATORY_CARE_PROVIDER_SITE_OTHER): Payer: BC Managed Care – PPO | Admitting: Family Medicine

## 2018-03-08 ENCOUNTER — Other Ambulatory Visit: Payer: Self-pay

## 2018-03-08 VITALS — BP 123/68 | HR 57 | Temp 98.6°F | Resp 16 | Ht 64.0 in | Wt 213.4 lb

## 2018-03-08 DIAGNOSIS — R7309 Other abnormal glucose: Secondary | ICD-10-CM

## 2018-03-08 DIAGNOSIS — E669 Obesity, unspecified: Secondary | ICD-10-CM

## 2018-03-08 DIAGNOSIS — G43709 Chronic migraine without aura, not intractable, without status migrainosus: Secondary | ICD-10-CM

## 2018-03-08 DIAGNOSIS — J309 Allergic rhinitis, unspecified: Secondary | ICD-10-CM | POA: Diagnosis not present

## 2018-03-08 DIAGNOSIS — IMO0002 Reserved for concepts with insufficient information to code with codable children: Secondary | ICD-10-CM

## 2018-03-08 DIAGNOSIS — Z0000 Encounter for general adult medical examination without abnormal findings: Secondary | ICD-10-CM

## 2018-03-08 LAB — POCT GLYCOSYLATED HEMOGLOBIN (HGB A1C): HEMOGLOBIN A1C: 5.7 % — AB (ref 4.0–5.6)

## 2018-03-08 MED ORDER — MONTELUKAST SODIUM 10 MG PO TABS: 10.0000 mg | ORAL_TABLET | Freq: Every day | ORAL | 1 refills | Status: DC

## 2018-03-08 MED ORDER — AIMOVIG 70 MG/ML ~~LOC~~ SOAJ: 70.0000 mg | SUBCUTANEOUS | 5 refills | Status: DC

## 2018-03-08 MED ORDER — FEXOFENADINE HCL 180 MG PO TABS: 180.0000 mg | ORAL_TABLET | Freq: Every day | ORAL | 1 refills | Status: DC

## 2018-03-08 NOTE — Assessment & Plan Note (Signed)
Stable without active flare, still has persistent symptoms better in season Followed by [facility] in past  Refill Singulair, Fexofenadine

## 2018-03-08 NOTE — Assessment & Plan Note (Signed)
Stable A1c 5.7 mild elevated near PreDM dx No fam history of DM  Plan:  1. Not on any therapy currently  2. Encourage improved lifestyle - low carb, low sugar diet, reduce portion size, continue regular exercise 3. Follow-up 6 months yearly labs

## 2018-03-08 NOTE — Assessment & Plan Note (Signed)
Weight mostly stable Encourage lifestyle diet

## 2018-03-08 NOTE — Assessment & Plan Note (Addendum)
Stable chronic migraines, none actively Frequency 1-2 x month = 4+ headache days Improved on abortive Excedrin Migraine Failed: Topamax, Wellbutrin, Midodrine  Plan Discussed med options for migraine abortive / prophylaxis - Agree to proceed with trial of new CGRP injectable medication - New rx sent Aimovig 70mg  monthly injection, with refills, reviewed rx information, safety/side effect, efficacy, demo pen, gave her copay card coupon and handout - If improve >50% reduced headache days, may continue, if not optimal result can increase to 140mg  dose by 3-6 months  Avoid triggers including foods, caffeine. Important to rest. Start headache diary, handout given, identify triggers for avoidance, bring to next visit Return criteria given for acute migraine, when to go to office vs ED

## 2018-03-08 NOTE — Patient Instructions (Addendum)
Thank you for coming to the office today.  For migraine prevention - start Aimovig injection once monthly - trial for 3-6 months, if effective reduce days of headache by half then keep on dose, otherwise if not quite as effective we can increase dose at next visit or after 3 months.  A1c 5.7 - keep up the great work to maintain this with lifestyle  DUE for FASTING BLOOD WORK (no food or drink after midnight before the lab appointment, only water or coffee without cream/sugar on the morning of)  SCHEDULE "Lab Only" visit in the morning at the clinic for lab draw in 6 MONTHS   - Make sure Lab Only appointment is at about 1 week before your next appointment, so that results will be available  For Lab Results, once available within 2-3 days of blood draw, you can can log in to MyChart online to view your results and a brief explanation. Also, we can discuss results at next follow-up visit.   Please schedule a Follow-up Appointment to: Return in about 6 months (around 09/06/2018) for Annual Physical.  If you have any other questions or concerns, please feel free to call the office or send a message through MyChart. You may also schedule an earlier appointment if necessary.  Additionally, you may be receiving a survey about your experience at our office within a few days to 1 week by e-mail or mail. We value your feedback.  Saralyn Pilar, DO [facility], [place]

## 2018-03-08 NOTE — Progress Notes (Signed)
Subjective:    Patient ID: Caroline Wang, female    DOB: [birth year], 48 y.o.   MRN: [id number]  Caroline Wang is a 48 y.o. female presenting on 03/08/2018 for elevated  A1C and Migraine   HPI  Elevated A1c / Obesity BMI >36 Prior lab A1c 5.7. Due today. No new concerns today. She does not have significant fam history of DM Meds: never on med before Lifestyle: - Weight down 2-3 lbs in 9 months, relatively stable in 6 months - Diet (still working on improving diet, no major changes) - Exercise (3-4 x weekly about 1-2 hours a day, mostly zumba classes with cardio and whole body exercise, does not do strength training) Denies hypoglycemia  FOLLOW-UP Chronic Migraines Reports long term problem for past 25 years. She has been treated in past with Wellbutrin, Topamax (side effects), off all meds for while now just takes Excedrin migraine. Frequency has 1-2 x migraine monthly with about 4+ headache days a month. Usually excedrin helps if takes before onset. Admits photosensitivity, no nausea or vomiting or other complicated symptoms. Unsure trigger. - She is interested in Aimovig therapy to learn more  Chronic allergic rhinosinusitis See last visit 08/2017 for background. She is currently stable in winter season less allergy symptoms On Singulair 10mg  nightly and Fexofenadine 180mg  daily, request refill.   Depression screen [facility] 2/9 03/08/2018 05/02/2017  Decreased Interest 0 0  Down, Depressed, Hopeless 0 0  PHQ - 2 Score 0 0  Altered sleeping - 0  Tired, decreased energy - 0  Change in appetite - 0  Feeling bad or failure about yourself  - 0  Trouble concentrating - 0  Moving slowly or fidgety/restless - 0  Suicidal thoughts - 0  PHQ-9 Score - 0  Difficult doing work/chores - Not difficult at all    Social History   Tobacco Use  . Smoking status: Never Smoker  . Smokeless tobacco: Never Used  Substance Use Topics  . Alcohol use: Yes    Alcohol/week: 0.0 standard drinks    . Drug use: No    Review of Systems Per HPI unless specifically indicated above     Objective:    BP 123/68   Pulse (!) 57   Temp 98.6 F (37 C) (Oral)   Resp 16   Ht 5\' 4"  (1.626 m)   Wt 213 lb 6.4 oz (96.8 kg)   BMI 36.63 kg/m   Wt Readings from Last 3 Encounters:  03/08/18 213 lb 6.4 oz (96.8 kg)  08/30/17 212 lb (96.2 kg)  05/02/17 216 lb (98 kg)    Physical Exam Vitals signs and nursing note reviewed.  Constitutional:      General: She is not in acute distress.    Appearance: She is well-developed. She is not diaphoretic.     Comments: Well-appearing, comfortable, cooperative  HENT:     Head: Normocephalic and atraumatic.  Eyes:     General:        Right eye: No discharge.        Left eye: No discharge.     Conjunctiva/sclera: Conjunctivae normal.  Neck:     Musculoskeletal: Normal range of motion and neck supple.     Thyroid: No thyromegaly.  Cardiovascular:     Rate and Rhythm: Normal rate and regular rhythm.     Heart sounds: Normal heart sounds. No murmur.  Pulmonary:     Effort: Pulmonary effort is normal. No respiratory distress.     Breath  sounds: Normal breath sounds. No wheezing or rales.  Musculoskeletal: Normal range of motion.  Lymphadenopathy:     Cervical: No cervical adenopathy.  Skin:    General: Skin is warm and dry.     Findings: No erythema or rash.  Neurological:     Mental Status: She is alert and oriented to person, place, and time.  Psychiatric:        Behavior: Behavior normal.     Comments: Well groomed, good eye contact, normal speech and thoughts      Recent Labs    08/23/17 0816 03/08/18 0833  HGBA1C 5.7* 5.7*    Results for orders placed or performed in visit on 03/08/18  POCT HgB A1C  Result Value Ref Range   Hemoglobin A1C 5.7 (A) 4.0 - 5.6 %      Assessment & Plan:   Problem List Items Addressed This Visit    Chronic allergic rhinitis    Stable without active flare, still has persistent symptoms better  in season Followed by [facility] in past  Refill Singulair, Fexofenadine      Relevant Medications   fexofenadine (ALLEGRA) 180 MG tablet   montelukast (SINGULAIR) 10 MG tablet   Chronic migraine    Stable chronic migraines, none actively Frequency 1-2 x month = 4+ headache days Improved on abortive Excedrin Migraine Failed: Topamax, Wellbutrin, Midodrine  Plan Discussed med options for migraine abortive / prophylaxis - Agree to proceed with trial of new CGRP injectable medication - New rx sent Aimovig 70mg  monthly injection, with refills, reviewed rx information, safety/side effect, efficacy, demo pen, gave her copay card coupon and handout - If improve >50% reduced headache days, may continue, if not optimal result can increase to 140mg  dose by 3-6 months  Avoid triggers including foods, caffeine. Important to rest. Start headache diary, handout given, identify triggers for avoidance, bring to next visit Return criteria given for acute migraine, when to go to office vs ED      Relevant Medications   AIMOVIG 70 MG/ML SOAJ   Elevated hemoglobin A1c - Primary    Stable A1c 5.7 mild elevated near PreDM dx No fam history of DM  Plan:  1. Not on any therapy currently  2. Encourage improved lifestyle - low carb, low sugar diet, reduce portion size, continue regular exercise 3. Follow-up 6 months yearly labs       Relevant Orders   POCT HgB A1C (Completed)   Obesity (BMI 35.0-39.9 without comorbidity)    Weight mostly stable Encourage lifestyle diet         Meds ordered this encounter  Medications  . fexofenadine (ALLEGRA) 180 MG tablet    Sig: Take 1 tablet (180 mg total) by mouth daily.    Dispense:  90 tablet    Refill:  1  . montelukast (SINGULAIR) 10 MG tablet    Sig: Take 1 tablet (10 mg total) by mouth at bedtime.    Dispense:  90 tablet    Refill:  1  . AIMOVIG 70 MG/ML SOAJ    Sig: Inject 70 mg into the skin every 30 (thirty) days.    Dispense:  1  pen    Refill:  5    Follow up plan: Return in about 6 months (around 09/06/2018) for Annual Physical.  Future labs ordered for 08/30/18   Saralyn Pilar, DO [facility] 03/08/2018, 8:19 AM

## 2018-03-13 ENCOUNTER — Encounter: Payer: Self-pay | Admitting: Family Medicine

## 2018-03-13 NOTE — Progress Notes (Signed)
Completed PA Form for Aimovig 70mg  monthly injection.  Dx G43.709 Chronic migraine  Meds tried and failed: Topamax (failed due to side effect, cognitive/confusion 2010), also failed Midodrine, Wellbutrin (ineffective 2010)  To be faxed 2/26  Saralyn Pilar, DO [facility] 03/13/2018, 5:10 PM

## 2018-08-08 ENCOUNTER — Other Ambulatory Visit: Payer: Self-pay | Admitting: Family Medicine

## 2018-08-08 DIAGNOSIS — Z1231 Encounter for screening mammogram for malignant neoplasm of breast: Secondary | ICD-10-CM

## 2018-08-30 ENCOUNTER — Other Ambulatory Visit: Payer: BC Managed Care – PPO

## 2018-09-06 ENCOUNTER — Encounter: Payer: BC Managed Care – PPO | Admitting: Family Medicine

## 2018-09-17 ENCOUNTER — Other Ambulatory Visit: Payer: Self-pay

## 2018-09-17 ENCOUNTER — Other Ambulatory Visit: Payer: BC Managed Care – PPO

## 2018-09-17 DIAGNOSIS — R7309 Other abnormal glucose: Secondary | ICD-10-CM

## 2018-09-17 DIAGNOSIS — Z0000 Encounter for general adult medical examination without abnormal findings: Secondary | ICD-10-CM

## 2018-09-18 ENCOUNTER — Other Ambulatory Visit: Payer: Self-pay | Admitting: Family Medicine

## 2018-09-18 ENCOUNTER — Ambulatory Visit
Admission: RE | Admit: 2018-09-18 | Discharge: 2018-09-18 | Disposition: A | Discharge status: Home / Self Care | Payer: BC Managed Care – PPO | Source: Ambulatory Visit | Attending: Family Medicine | Admitting: Family Medicine

## 2018-09-18 ENCOUNTER — Other Ambulatory Visit: Payer: Self-pay

## 2018-09-18 DIAGNOSIS — Z1231 Encounter for screening mammogram for malignant neoplasm of breast: Secondary | ICD-10-CM | POA: Diagnosis not present

## 2018-09-18 DIAGNOSIS — N6310 Unspecified lump in the right breast, unspecified quadrant: Secondary | ICD-10-CM

## 2018-09-18 DIAGNOSIS — R928 Other abnormal and inconclusive findings on diagnostic imaging of breast: Secondary | ICD-10-CM

## 2018-09-18 LAB — COMPLETE METABOLIC PANEL WITH GFR
AG Ratio: 1.3 (calc) (ref 1.0–2.5)
ALT: 23 U/L (ref 6–29)
AST: 28 U/L (ref 10–35)
Albumin: 3.9 g/dL (ref 3.6–5.1)
Alkaline phosphatase (APISO): 56 U/L (ref 31–125)
BUN: 11 mg/dL (ref 7–25)
CO2: 28 mmol/L (ref 20–32)
Calcium: 9.1 mg/dL (ref 8.6–10.2)
Chloride: 103 mmol/L (ref 98–110)
Creat: 0.98 mg/dL (ref 0.50–1.10)
GFR, Est African American: 80 mL/min/{1.73_m2} (ref >=60)
GFR, Est Non African American: 69 mL/min/{1.73_m2} (ref >=60)
Globulin: 2.9 g/dL (calc) (ref 1.9–3.7)
Glucose, Bld: 108 mg/dL — ABNORMAL HIGH (ref 65–99)
Potassium: 4 mmol/L (ref 3.5–5.3)
Sodium: 137 mmol/L (ref 135–146)
Total Bilirubin: 0.7 mg/dL (ref 0.2–1.2)
Total Protein: 6.8 g/dL (ref 6.1–8.1)

## 2018-09-18 LAB — LIPID PANEL
Cholesterol: 163 mg/dL (ref <200)
HDL: 68 mg/dL (ref >=50)
LDL Cholesterol (Calc): 81 mg/dL (calc)
Non-HDL Cholesterol (Calc): 95 mg/dL (calc) (ref <130)
Total CHOL/HDL Ratio: 2.4 (calc) (ref <5.0)
Triglycerides: 59 mg/dL (ref <150)

## 2018-09-18 LAB — CBC WITH DIFFERENTIAL/PLATELET
Absolute Monocytes: 559 cells/uL (ref 200–950)
Basophils Absolute: 23 cells/uL (ref 0–200)
Basophils Relative: 0.4 %
Eosinophils Absolute: 211 cells/uL (ref 15–500)
Eosinophils Relative: 3.7 %
HCT: 36.2 % (ref 35.0–45.0)
Hemoglobin: 12 g/dL (ref 11.7–15.5)
Lymphs Abs: 1756 cells/uL (ref 850–3900)
MCH: 29.4 pg (ref 27.0–33.0)
MCHC: 33.1 g/dL (ref 32.0–36.0)
MCV: 88.7 fL (ref 80.0–100.0)
MPV: 10.3 fL (ref 7.5–12.5)
Monocytes Relative: 9.8 %
Neutro Abs: 3152 cells/uL (ref 1500–7800)
Neutrophils Relative %: 55.3 %
Platelets: 389 10*3/uL (ref 140–400)
RBC: 4.08 10*6/uL (ref 3.80–5.10)
RDW: 13.1 % (ref 11.0–15.0)
Total Lymphocyte: 30.8 %
WBC: 5.7 10*3/uL (ref 3.8–10.8)

## 2018-09-18 LAB — TSH: TSH: 1.07 mIU/L

## 2018-09-18 LAB — HEMOGLOBIN A1C
Hgb A1c MFr Bld: 5.7 % of total Hgb — ABNORMAL HIGH (ref <5.7)
Mean Plasma Glucose: 117 (calc)
eAG (mmol/L): 6.5 (calc)

## 2018-09-19 ENCOUNTER — Encounter: Payer: Self-pay | Admitting: Family Medicine

## 2018-09-19 ENCOUNTER — Ambulatory Visit (INDEPENDENT_AMBULATORY_CARE_PROVIDER_SITE_OTHER): Payer: BC Managed Care – PPO | Admitting: Family Medicine

## 2018-09-19 VITALS — BP 130/80 | HR 60 | Temp 98.7°F | Resp 15 | Ht 64.0 in | Wt 217.0 lb

## 2018-09-19 DIAGNOSIS — Z0000 Encounter for general adult medical examination without abnormal findings: Secondary | ICD-10-CM | POA: Diagnosis not present

## 2018-09-19 DIAGNOSIS — J309 Allergic rhinitis, unspecified: Secondary | ICD-10-CM

## 2018-09-19 DIAGNOSIS — G43709 Chronic migraine without aura, not intractable, without status migrainosus: Secondary | ICD-10-CM

## 2018-09-19 DIAGNOSIS — E669 Obesity, unspecified: Secondary | ICD-10-CM

## 2018-09-19 DIAGNOSIS — R928 Other abnormal and inconclusive findings on diagnostic imaging of breast: Secondary | ICD-10-CM

## 2018-09-19 DIAGNOSIS — IMO0002 Reserved for concepts with insufficient information to code with codable children: Secondary | ICD-10-CM

## 2018-09-19 DIAGNOSIS — R7309 Other abnormal glucose: Secondary | ICD-10-CM

## 2018-09-19 MED ORDER — FEXOFENADINE HCL 180 MG PO TABS: 180.0000 mg | ORAL_TABLET | Freq: Every day | ORAL | 3 refills | Status: DC

## 2018-09-19 MED ORDER — AIMOVIG 70 MG/ML ~~LOC~~ SOAJ: 70.0000 mg | SUBCUTANEOUS | 11 refills | Status: DC

## 2018-09-19 MED ORDER — MONTELUKAST SODIUM 10 MG PO TABS: 10.0000 mg | ORAL_TABLET | Freq: Every day | ORAL | 3 refills | Status: DC

## 2018-09-19 NOTE — Assessment & Plan Note (Signed)
Stable A1c 5.7 mild elevated near PreDM dx - unchanged >1 yr No fam history of DM  Plan:  1. Not on any therapy currently  2. Encourage improved lifestyle - low carb, low sugar diet, reduce portion size, continue regular exercise 3. Follow-up 6 months A1c check

## 2018-09-19 NOTE — Patient Instructions (Addendum)
Thank you for coming to the office today.  Reconsider Flu Shot - nurse visit if needed  Keep up great work! Work on improving diet as we discussed and we can keep A1c controlled  Continue Aimovig, re ordered, restart whenever you are ready  Mammogram had 2 abnormal spots, to be determined, next test needs to be scheduled ultrasound and diagnostic, they will call you if not then you can call them.  Great lab results overall.  Refilled all meds 1 year.  Monitor discharge if not resolved can contact us and re-schedule for a swab collection.   Colon Cancer Screening: - For all adults age 50+ routine colon cancer screening is highly recommended.     - Recent guidelines from [employer] recommend starting age of 45 - Early detection of colon cancer is important, because often there are no warning signs or symptoms, also if found early usually it can be cured. Late stage is hard to treat.  - If you are not interested in Colonoscopy screening (if done and normal you could be cleared for 5 to 10 years until next due), then Cologuard is an excellent alternative for screening test for Colon Cancer. It is highly sensitive for detecting DNA of colon cancer from even the earliest stages. Also, there is NO bowel prep required. - If Cologuard is NEGATIVE, then it is good for 3 years before next due - If Cologuard is POSITIVE, then it is strongly advised to get a Colonoscopy, which allows the GI doctor to locate the source of the cancer or polyp (even very early stage) and treat it by removing it. ------------------------- If you would like to proceed with Cologuard (stool DNA test) - FIRST, call your insurance company and tell them you want to check cost of Cologuard tell them CPT Code 81528 (it may be completely covered and you could get for no cost, OR max cost without any coverage is about $600). Also, keep in mind if you do NOT open the kit, and decide not to do the test, you will NOT be  charged, you should contact the company if you decide not to do the test. - If you want to proceed, you can notify us (phone message, MyChart Message, or at next visit) and we will order it for you. The test kit will be delivered to you house within about 1 week. Follow instructions to collect sample, you may call the company for any help or questions, 24/7 telephone support at [phone].   Please schedule a Follow-up Appointment to: Return in about 6 months (around 03/19/2019) for 6 month PreDM A1c, migraines.  If you have any other questions or concerns, please feel free to call the office or send a message through MyChart. You may also schedule an earlier appointment if necessary.  Additionally, you may be receiving a survey about your experience at our office within a few days to 1 week by e-mail or mail. We value your feedback.  Alexander Karamalegos, DO [facility], [facility] 

## 2018-09-19 NOTE — Assessment & Plan Note (Signed)
Dramatic improved migraines now on Aimovig with nearly resolved headaches No active headache Frequency 1-2 x month = 4+ headache days - now down to nearly 0 headache days a month Failed: Topamax, Wellbutrin, Midodrine  Plan - Continue Aimovig 70mg  monthly injection CGRPi - sent refills up to 1 year - In future - if improve >50% reduced headache days, may continue, if not optimal result can increase to 140mg  dose by 3-6 months  Avoid triggers including foods, caffeine. Important to rest. Monitor headache diary, handout given, identify triggers for avoidance Return criteria given for acute migraine, when to go to office vs ED

## 2018-09-19 NOTE — Assessment & Plan Note (Signed)
Weight stable, some slight gain Encourage improve diet/exercise wt loss 

## 2018-09-19 NOTE — Assessment & Plan Note (Signed)
Stable without active flare Followed by [facility] in past  Refill Singulair, Fexofenadine

## 2018-09-19 NOTE — Progress Notes (Signed)
Subjective:    Patient ID: Caroline Wang, female    DOB: 10/28/1970, 48 y.o.   MRN: [id number]  Caroline Amenamela R Mccombie is a 48 y.o. female presenting on 09/19/2018 for Annual Exam and Vaginal Discharge (2 week no odor or no color. )   HPI   Here for Annual Physical and Lab Review.  Elevated A1c/ Obesity BMI >36 Prior lab A1c 5.7. Due today. No new concerns today. She does not have significant fam history of DM Meds:never on med before Lifestyle: - Weight down 2-3 lbs in 9 months, relatively stable in 6 months - Diet (stopped drinking sodas, drinks some natural juice, does admit eating bread and rice still but improving overall) - Exercise (3-4 x weekly about 1-2 hours a day, mostly zumba classes with cardio and whole body exercise, does not do strength training) Denies hypoglycemia  FOLLOW-UP Chronic Migraines - Last visit with me 02/2018, for same problem, treated with Aimovig CGRP inhib, see prior notes for background information. - Interval update with dramatic improvement has done well with Aimovig monthly injection, has been migraine free for 6 months and mostly resolved mild headache days - Today patient reports no new concerns, she did miss 1 month of injection due to travel and ready to dose it again and start up on med, no return of headaches or migraine, she has not had side effect, she admits a mild rash around mouth from wearing mask most likely  Chronic allergic rhinosinusitis See last visit 08/2017 for background. She is currently stable without flare On Singulair 10mg  nightly and Fexofenadine 180mg  daily, request refill.   ----------------------------------  Additional question:  Vaginal Discharge Reports new problem 2 weeks, with odorless and asymptomatic vaginal discharge. She has had prior yeast and BV before and this does not feel like either. No link to sexual activity. She has s/p hysterectomy and has ovary didn't think it was residual menstrual related. No  other symptoms   Health Maintenance:  Due for Flu Shot - declines today will reconsider.  Cervical Cancer Screening: S/p hysterectomy not indicated for further pap smears, s/p TAH with removal of cervix.  Breast CA Screening: UTD. Last mammogram result Bi-Raids 0 (PENDING needs further testing see result below- R breast abnormal location x 2- would need diagnostic mammo and US next R side) she will f/u with [facility]. No prior history abnormal mammogram. No known family history of breast cancer. Currently asymptomatic.  Colon CA Screening: Never had colonoscopy. She would be eligible for guidelines for age 36>45. No known family history of colon CA. She declines today despite counseling.  [facility] 2[facility] 03/08/2018 05/02/2017  Decreased Interest 0 0  Down, Depressed, Hopeless 0 0  PHQ - 2 Score 0 0  Altered sleeping - 0  Tired, decreased energy - 0  Change in appetite - 0  Feeling bad or failure about yourself  - 0  Trouble concentrating - 0  Moving slowly or fidgety/restless - 0  Suicidal thoughts - 0  PHQ-9 Score - 0  Difficult doing work/chores - Not difficult at all    No past medical history on file. Past Surgical History:  Procedure Laterality Date   TOTAL ABDOMINAL HYSTERECTOMY  02/2004   Total Abdominal including ovaries, cervix - [facility] Dr Talbert CageWhite Jr   Social History   Socioeconomic History   Marital status: Single    Spouse name: Not on file   Number of children: 2   Years of education: Not on  file   Highest education level: Not on file  Occupational History   Not on file  Social Needs   Financial resource strain: Not on file   Food insecurity    Worry: Not on file    Inability: Not on file   Transportation needs    Medical: Not on file    Non-medical: Not on file  Tobacco Use   Smoking status: Never Smoker   Smokeless tobacco: Never Used  Substance and Sexual Activity   Alcohol use: Yes    Alcohol/week:  0.0 standard drinks   Drug use: No   Sexual activity: Not on file  Lifestyle   Physical activity    Days per week: Not on file    Minutes per session: Not on file   Stress: Not on file  Relationships   Social connections    Talks on phone: Not on file    Gets together: Not on file    Attends religious service: Not on file    Active member of club or organization: Not on file    Attends meetings of clubs or organizations: Not on file    Relationship status: Not on file   Intimate partner violence    Fear of current or ex partner: Not on file    Emotionally abused: Not on file    Physically abused: Not on file    Forced sexual activity: Not on file  Other Topics Concern   Not on file  Social History Narrative   Not on file   Family History  Problem Relation Age of Onset   Other Maternal Aunt 90       Pre-Diabetes   Breast cancer Neg Hx    Diabetes Neg Hx    Colon cancer Neg Hx    No current outpatient medications on file prior to visit.   No current facility-administered medications on file prior to visit.     Review of Systems  Constitutional: Negative for activity change, appetite change, chills, diaphoresis, fatigue and fever.  HENT: Negative for congestion and hearing loss.   Eyes: Negative for visual disturbance.  Respiratory: Negative for cough, chest tightness, shortness of breath and wheezing.   Cardiovascular: Negative for chest pain, palpitations and leg swelling.  Gastrointestinal: Negative for abdominal pain, constipation, diarrhea, nausea and vomiting.  Genitourinary: Negative for dysuria, frequency and hematuria.  Musculoskeletal: Negative for arthralgias and neck pain.  Skin: Negative for rash.  Neurological: Negative for dizziness, weakness, light-headedness, numbness and headaches.  Hematological: Negative for adenopathy.  Psychiatric/Behavioral: Negative for behavioral problems, dysphoric mood and sleep disturbance.   Per HPI unless  specifically indicated above      Objective:    BP 130/80 (BP Location: Left Arm, Cuff Size: Normal)    Pulse 60    Temp 98.7 F (37.1 C) (Oral)    Resp 15    Ht 5\' 4"  (1.626 m)    Wt 217 lb (98.4 kg)    SpO2 100%    BMI 37.25 kg/m   Wt Readings from Last 3 Encounters:  09/19/18 217 lb (98.4 kg)  03/08/18 213 lb 6.4 oz (96.8 kg)  08/30/17 212 lb (96.2 kg)    Physical Exam Vitals signs and nursing note reviewed.  Constitutional:      General: She is not in acute distress.    Appearance: She is well-developed. She is not diaphoretic.     Comments: Well-appearing, comfortable, cooperative  HENT:     Head: Normocephalic and atraumatic.  Eyes:     General:        Right eye: No discharge.        Left eye: No discharge.     Conjunctiva/sclera: Conjunctivae normal.     Pupils: Pupils are equal, round, and reactive to light.  Neck:     Musculoskeletal: Normal range of motion and neck supple.     Thyroid: No thyromegaly.  Cardiovascular:     Rate and Rhythm: Normal rate and regular rhythm.     Heart sounds: Normal heart sounds. No murmur.  Pulmonary:     Effort: Pulmonary effort is normal. No respiratory distress.     Breath sounds: Normal breath sounds. No wheezing or rales.  Abdominal:     General: Bowel sounds are normal. There is no distension.     Palpations: Abdomen is soft. There is no mass.     Tenderness: There is no abdominal tenderness.  Musculoskeletal: Normal range of motion.        General: No tenderness.     Comments: Upper / Lower Extremities: - Normal muscle tone, strength bilateral upper extremities 5/5, lower extremities 5/5  Lymphadenopathy:     Cervical: No cervical adenopathy.  Skin:    General: Skin is warm and dry.     Findings: No erythema or rash.  Neurological:     Mental Status: She is alert and oriented to person, place, and time.     Comments: Distal sensation intact to light touch all extremities  Psychiatric:        Behavior: Behavior  normal.     Comments: Well groomed, good eye contact, normal speech and thoughts    Results for orders placed or performed in visit on 09/17/18  TSH  Result Value Ref Range   TSH 1.07 mIU/L  Lipid panel  Result Value Ref Range   Cholesterol 163 <200 mg/dL   HDL 68 > OR = 50 mg/dL   Triglycerides 59 <454<150 mg/dL   LDL Cholesterol (Calc) 81 mg/dL (calc)   Total CHOL/HDL Ratio 2.4 <5.0 (calc)   Non-HDL Cholesterol (Calc) 95 <098<130 mg/dL (calc)  COMPLETE METABOLIC PANEL WITH GFR  Result Value Ref Range   Glucose, Bld 108 (H) 65 - 99 mg/dL   BUN 11 7 - 25 mg/dL   Creat 1.190.98 1.470.50 - 8.291.10 mg/dL   GFR, Est Non African American 69 > OR = 60 mL/min/1.5573m2   GFR, Est African American 80 > OR = 60 mL/min/1.473m2   BUN/Creatinine Ratio NOT APPLICABLE 6 - 22 (calc)   Sodium 137 135 - 146 mmol/L   Potassium 4.0 3.5 - 5.3 mmol/L   Chloride 103 98 - 110 mmol/L   CO2 28 20 - 32 mmol/L   Calcium 9.1 8.6 - 10.2 mg/dL   Total Protein 6.8 6.1 - 8.1 g/dL   Albumin 3.9 3.6 - 5.1 g/dL   Globulin 2.9 1.9 - 3.7 g/dL (calc)   AG Ratio 1.3 1.0 - 2.5 (calc)   Total Bilirubin 0.7 0.2 - 1.2 mg/dL   Alkaline phosphatase (APISO) 56 31 - 125 U/L   AST 28 10 - 35 U/L   ALT 23 6 - 29 U/L  CBC with Differential/Platelet  Result Value Ref Range   WBC 5.7 3.8 - 10.8 Thousand/uL   RBC 4.08 3.80 - 5.10 Million/uL   Hemoglobin 12.0 11.7 - 15.5 g/dL   HCT 56.236.2 13.035.0 - 86.545.0 %   MCV 88.7 80.0 - 100.0 fL   MCH 29.4 27.0 - 33.0  pg   MCHC 33.1 32.0 - 36.0 g/dL   RDW 16.1 09.6 - 04.5 %   Platelets 389 140 - 400 Thousand/uL   MPV 10.3 7.5 - 12.5 fL   Neutro Abs 3,152 1,500 - 7,800 cells/uL   Lymphs Abs 1,756 850 - 3,900 cells/uL   Absolute Monocytes 559 200 - 950 cells/uL   Eosinophils Absolute 211 15 - 500 cells/uL   Basophils Absolute 23 0 - 200 cells/uL   Neutrophils Relative % 55.3 %   Total Lymphocyte 30.8 %   Monocytes Relative 9.8 %   Eosinophils Relative 3.7 %   Basophils Relative 0.4 %  Hemoglobin A1c    Result Value Ref Range   Hgb A1c MFr Bld 5.7 (H) <5.7 % of total Hgb   Mean Plasma Glucose 117 (calc)   eAG (mmol/L) 6.5 (calc)   I have personally reviewed the radiology report from 09/18/18 on mammogram.  CLINICAL DATA:  Screening.  EXAM: DIGITAL SCREENING BILATERAL MAMMOGRAM WITH TOMO AND CAD  COMPARISON:  Previous exam(s).  ACR Breast Density Category b: There are scattered areas of fibroglandular density.  FINDINGS: In the right breast, 2 possible masses warrant further evaluation. In the left breast, no findings suspicious for malignancy. Images were processed with CAD.  IMPRESSION: Further evaluation is suggested for 2 possible masses in the right breast.  RECOMMENDATION: Diagnostic mammogram and possibly ultrasound of the right breast. (Code:[id number]-43M)  The patient will be contacted regarding the findings, and additional imaging will be scheduled.  BI-RADS CATEGORY  0: Incomplete. Need additional imaging evaluation and/or prior mammograms for comparison.   Electronically Signed   By: Britta Mccreedy M.D.   On: 09/18/2018 10:09      Assessment & Plan:   Problem List Items Addressed This Visit    Chronic allergic rhinitis    Stable without active flare Followed by [facility] in past  Refill Singulair, Fexofenadine      Relevant Medications   fexofenadine (ALLEGRA) 180 MG tablet   montelukast (SINGULAIR) 10 MG tablet   Chronic migraine    Dramatic improved migraines now on Aimovig with nearly resolved headaches No active headache Frequency 1-2 x month = 4+ headache days - now down to nearly 0 headache days a month Failed: Topamax, Wellbutrin, Midodrine  Plan - Continue Aimovig 70mg  monthly injection CGRPi - sent refills up to 1 year - In future - if improve >50% reduced headache days, may continue, if not optimal result can increase to 140mg  dose by 3-6 months  Avoid triggers including foods, caffeine. Important to rest. Monitor  headache diary, handout given, identify triggers for avoidance Return criteria given for acute migraine, when to go to office vs ED      Relevant Medications   AIMOVIG 70 MG/ML SOAJ   Elevated hemoglobin A1c    Stable A1c 5.7 mild elevated near PreDM dx - unchanged >1 yr No fam history of DM  Plan:  1. Not on any therapy currently  2. Encourage improved lifestyle - low carb, low sugar diet, reduce portion size, continue regular exercise 3. Follow-up 6 months A1c check      Obesity (BMI 35.0-39.9 without comorbidity)    Weight stable, some slight gain Encourage improve diet/exercise wt loss       Other Visit Diagnoses    Annual physical exam    -  Primary   Abnormal mammogram of right breast        Will need diagnostic R Mammo and R Ultrasound next  Updated Health Maintenance information - Flu shot deferred for now, will reconsider - Offered Colon CA Screening cologuard age 74+, declined for now Reviewed recent lab results with patient Encouraged improvement to lifestyle with diet and exercise - Goal of weight loss   #vaginal discharge Likely physiological, asymptomatic Monitor for now Consider empiric treat BV if needed or return for swab test if need  Meds ordered this encounter  Medications   AIMOVIG 70 MG/ML SOAJ    Sig: Inject 70 mg into the skin every 30 (thirty) days.    Dispense:  1 pen    Refill:  11   fexofenadine (ALLEGRA) 180 MG tablet    Sig: Take 1 tablet (180 mg total) by mouth daily.    Dispense:  90 tablet    Refill:  3   montelukast (SINGULAIR) 10 MG tablet    Sig: Take 1 tablet (10 mg total) by mouth at bedtime.    Dispense:  90 tablet    Refill:  3    Follow up plan: Return in about 6 months (around 03/19/2019) for 6 month PreDM A1c, migraines.  Saralyn Pilar, DO [facility] 09/19/2018, 8:37 AM

## 2018-09-28 ENCOUNTER — Ambulatory Visit
Admission: RE | Admit: 2018-09-28 | Discharge: 2018-09-28 | Disposition: A | Discharge status: Home / Self Care | Payer: BC Managed Care – PPO | Source: Ambulatory Visit | Attending: Family Medicine | Admitting: Family Medicine

## 2018-09-28 ENCOUNTER — Other Ambulatory Visit: Payer: Self-pay

## 2018-09-28 DIAGNOSIS — N6310 Unspecified lump in the right breast, unspecified quadrant: Secondary | ICD-10-CM

## 2018-09-28 DIAGNOSIS — R928 Other abnormal and inconclusive findings on diagnostic imaging of breast: Secondary | ICD-10-CM

## 2018-09-28 DIAGNOSIS — Z20822 Contact with and (suspected) exposure to covid-19: Secondary | ICD-10-CM

## 2018-09-30 LAB — NOVEL CORONAVIRUS, NAA: SARS-CoV-2, NAA: NOT DETECTED

## 2019-03-18 ENCOUNTER — Other Ambulatory Visit: Payer: Self-pay

## 2019-03-18 DIAGNOSIS — R7309 Other abnormal glucose: Secondary | ICD-10-CM

## 2019-03-19 ENCOUNTER — Encounter: Payer: Self-pay | Admitting: Family Medicine

## 2019-03-19 ENCOUNTER — Other Ambulatory Visit: Payer: BC Managed Care – PPO

## 2019-03-19 ENCOUNTER — Ambulatory Visit: Payer: BC Managed Care – PPO | Admitting: Family Medicine

## 2019-03-19 ENCOUNTER — Ambulatory Visit (INDEPENDENT_AMBULATORY_CARE_PROVIDER_SITE_OTHER): Payer: BC Managed Care – PPO | Admitting: Family Medicine

## 2019-03-19 ENCOUNTER — Other Ambulatory Visit: Payer: Self-pay

## 2019-03-19 ENCOUNTER — Other Ambulatory Visit: Payer: Self-pay | Admitting: Family Medicine

## 2019-03-19 VITALS — Ht 64.0 in | Wt 222.0 lb

## 2019-03-19 DIAGNOSIS — R7309 Other abnormal glucose: Secondary | ICD-10-CM

## 2019-03-19 DIAGNOSIS — E669 Obesity, unspecified: Secondary | ICD-10-CM

## 2019-03-19 DIAGNOSIS — Z0000 Encounter for general adult medical examination without abnormal findings: Secondary | ICD-10-CM

## 2019-03-19 NOTE — Patient Instructions (Addendum)
Stay tuned for A1c test result. If still around 5.7 to < 6.0, then keep on improving lifestyle diet and exercise, goal to limit soda and sugars / carbs and weight loss.  DUE for FASTING BLOOD WORK (no food or drink after midnight before the lab appointment, only water or coffee without cream/sugar on the morning of)  SCHEDULE "Lab Only" visit in the morning at the clinic for lab draw in 6 MONTHS   - Make sure Lab Only appointment is at about 1 week before your next appointment, so that results will be available  For Lab Results, once available within 2-3 days of blood draw, you can can log in to MyChart online to view your results and a brief explanation. Also, we can discuss results at next follow-up visit.   Please schedule a Follow-up Appointment to: Return in about 6 months (around 09/19/2019) for Annual Physical.  If you have any other questions or concerns, please feel free to call the office or send a message through MyChart. You may also schedule an earlier appointment if necessary.  Additionally, you may be receiving a survey about your experience at our office within a few days to 1 week by e-mail or mail. We value your feedback.  Saralyn Pilar, DO [facility], [place]

## 2019-03-19 NOTE — Progress Notes (Signed)
Virtual Visit via Telephone The purpose of this virtual visit is to provide medical care while limiting exposure to the novel coronavirus (COVID19) for both patient and office staff.  Consent was obtained for phone visit:  Yes.   Answered questions that patient had about telehealth interaction:  Yes.   I discussed the limitations, risks, security and privacy concerns of performing an evaluation and management service by telephone. I also discussed with the patient that there may be a patient responsible charge related to this service. The patient expressed understanding and agreed to proceed.  Patient Location: Home Provider Location: [facility])  ---------------------------------------------------------------------- Chief Complaint  Patient presents with  . PreDM    6 month follow up    S: Reviewed CMA documentation. I have called patient and gathered additional HPI as follows:  Elevated A1c/ Obesity BMI >38 Prior lab A1c 5.7. Last lab A1c this AM, pending result. Admits some weight gain. Otherwise work stress not following diet as much She does not have significant fam history of DM Meds:never on med before Lifestyle: - Weight gain 4-5 lbs - Diet (Some drinking sodas again, and not always adhering but trying to improve diet) - Exercise (previously more regular exercise) Denies hypoglycemia  Denies any high risk travel to areas of current concern for COVID19. Denies any known or suspected exposure to person with or possibly with COVID19.  Denies any fevers, chills, sweats, body ache, cough, shortness of breath, sinus pain or pressure, headache, abdominal pain, diarrhea  History reviewed. No pertinent past medical history. Social History   Tobacco Use  . Smoking status: Never Smoker  . Smokeless tobacco: Never Used  Substance Use Topics  . Alcohol use: Yes    Alcohol/week: 0.0 standard drinks  . Drug use: No    Current Outpatient Medications:   .  AIMOVIG 70 MG/ML SOAJ, Inject 70 mg into the skin every 30 (thirty) days., Disp: 1 pen, Rfl: 11 .  fexofenadine (ALLEGRA) 180 MG tablet, Take 1 tablet (180 mg total) by mouth daily., Disp: 90 tablet, Rfl: 3 .  montelukast (SINGULAIR) 10 MG tablet, Take 1 tablet (10 mg total) by mouth at bedtime., Disp: 90 tablet, Rfl: 3  Depression screen [facility] 2/9 03/19/2019 03/08/2018 05/02/2017  Decreased Interest 0 0 0  Down, Depressed, Hopeless 0 0 0  PHQ - 2 Score 0 0 0  Altered sleeping - - 0  Tired, decreased energy - - 0  Change in appetite - - 0  Feeling bad or failure about yourself  - - 0  Trouble concentrating - - 0  Moving slowly or fidgety/restless - - 0  Suicidal thoughts - - 0  PHQ-9 Score - - 0  Difficult doing work/chores - - Not difficult at all    No flowsheet data found.  -------------------------------------------------------------------------- O: No physical exam performed due to remote telephone encounter.  Lab results reviewed.  No results found for this or any previous visit (from the past 2160 hour(s)).  -------------------------------------------------------------------------- A&P:  Problem List Items Addressed This Visit    Obesity (BMI 35.0-39.9 without comorbidity)    Weight stable, some slight gain Encourage improve diet/exercise wt loss      Elevated hemoglobin A1c - Primary    Due for A1c today, lab in AM, pending result still Previously Stable A1c 5.7 mild elevated near PreDM dx - unchanged >1 yr No fam history of DM  Plan:  A1c today, f/u result - on mychart 1. Not on any therapy currently  2. Encourage improved lifestyle - low carb, low sugar diet, reduce portion size, emphasis on stopping sodas ans improving, resume regular exercise F/u 6 months         No orders of the defined types were placed in this encounter.   Follow-up: - Return in 6 months for Annual Physical - Future labs ordered for 09/18/19  Patient verbalizes understanding with  the above medical recommendations including the limitation of remote medical advice.  Specific follow-up and call-back criteria were given for patient to follow-up or seek medical care more urgently if needed.   - Time spent in direct consultation with patient on phone: 7 minutes  Saralyn Pilar, DO [facility] 03/19/2019, 3:11 PM

## 2019-03-19 NOTE — Assessment & Plan Note (Signed)
Weight stable, some slight gain Encourage improve diet/exercise wt loss

## 2019-03-19 NOTE — Assessment & Plan Note (Signed)
Due for A1c today, lab in AM, pending result still Previously Stable A1c 5.7 mild elevated near PreDM dx - unchanged >1 yr No fam history of DM  Plan:  A1c today, f/u result - on mychart 1. Not on any therapy currently  2. Encourage improved lifestyle - low carb, low sugar diet, reduce portion size, emphasis on stopping sodas ans improving, resume regular exercise F/u 6 months

## 2019-03-20 LAB — HEMOGLOBIN A1C
Hgb A1c MFr Bld: 6 % of total Hgb — ABNORMAL HIGH (ref <5.7)
Mean Plasma Glucose: 126 (calc)
eAG (mmol/L): 7 (calc)

## 2019-05-27 ENCOUNTER — Other Ambulatory Visit: Payer: Self-pay | Admitting: Family Medicine

## 2019-05-27 DIAGNOSIS — J309 Allergic rhinitis, unspecified: Secondary | ICD-10-CM

## 2019-05-27 NOTE — Telephone Encounter (Signed)
Requested Prescriptions  Pending Prescriptions Disp Refills  . fexofenadine (ALLEGRA) 180 MG tablet [Pharmacy Med Name: FEXOFENADINE 180MG  TABLETS (OTC)] 90 tablet 3    Sig: TAKE 1 TABLET BY MOUTH DAILY     Ear, Nose, and Throat:  Antihistamines Failed - 05/27/2019 10:35 PM      Failed - Valid encounter within last 12 months    Recent Outpatient Visits          2 months ago Elevated hemoglobin A1c   [facility] [place], [place]   8 months ago Annual physical exam   [facility], [place]   1 year ago Elevated hemoglobin A1c   [facility], DO   1 year ago Annual physical exam   [facility], DO   2 years ago Chronic allergic rhinitis   [facility], DO             Patient had valid encounter on 03/19/2019.

## 2019-06-04 ENCOUNTER — Encounter: Payer: Self-pay | Admitting: Family Medicine

## 2019-06-04 ENCOUNTER — Other Ambulatory Visit: Payer: Self-pay

## 2019-06-04 ENCOUNTER — Ambulatory Visit (INDEPENDENT_AMBULATORY_CARE_PROVIDER_SITE_OTHER): Payer: BC Managed Care – PPO | Admitting: Family Medicine

## 2019-06-04 VITALS — BP 135/81 | HR 67 | Temp 97.5°F | Resp 16 | Ht 64.0 in | Wt 224.0 lb

## 2019-06-04 DIAGNOSIS — M79671 Pain in right foot: Secondary | ICD-10-CM | POA: Diagnosis not present

## 2019-06-04 DIAGNOSIS — G8929 Other chronic pain: Secondary | ICD-10-CM | POA: Diagnosis not present

## 2019-06-04 DIAGNOSIS — M21612 Bunion of left foot: Secondary | ICD-10-CM

## 2019-06-04 DIAGNOSIS — M2140 Flat foot [pes planus] (acquired), unspecified foot: Secondary | ICD-10-CM | POA: Insufficient documentation

## 2019-06-04 DIAGNOSIS — M21611 Bunion of right foot: Secondary | ICD-10-CM | POA: Diagnosis not present

## 2019-06-04 DIAGNOSIS — M2142 Flat foot [pes planus] (acquired), left foot: Secondary | ICD-10-CM

## 2019-06-04 DIAGNOSIS — M79672 Pain in left foot: Secondary | ICD-10-CM

## 2019-06-04 DIAGNOSIS — M2141 Flat foot [pes planus] (acquired), right foot: Secondary | ICD-10-CM

## 2019-06-04 NOTE — Progress Notes (Signed)
Subjective:    Patient ID: Caroline Wang, female    DOB: Mar 13, 1970, 49 y.o.   MRN: [id number]  Caroline Wang is a 49 y.o. female presenting on 06/04/2019 for Foot Pain (referral to podiatry--[facility])   HPI   Chronic Foot Pain Bilateral / Pes Planus / Bilateral Great Toe [place] Reports chronic problem now for >1 year in past now, seems episodic painful episodes in feet, typically flare will last 1-2 days, radiating from heel to toes inner aspect, both sides are similar, without one foot being worse usually will both flare up at the same time. Last flare up was very severe inc pain and now wanted to be seen. If has flare up, she will sit and rest and rub feet, and take Ibuprofen 200mg  x 4 = 800mg  every 8 hours PRN. If flare, will be worse with ambulation and weight bearing, pain is not worse or present in the morning, will be worse later in day with excessive walking usually improves overnight.   Health Maintenance: UTD COVID19 vaccine  Depression screen [facility] 2/9 03/19/2019 03/08/2018 05/02/2017  Decreased Interest 0 0 0  Down, Depressed, Hopeless 0 0 0  PHQ - 2 Score 0 0 0  Altered sleeping - - 0  Tired, decreased energy - - 0  Change in appetite - - 0  Feeling bad or failure about yourself  - - 0  Trouble concentrating - - 0  Moving slowly or fidgety/restless - - 0  Suicidal thoughts - - 0  PHQ-9 Score - - 0  Difficult doing work/chores - - Not difficult at all    Social History   Tobacco Use  . Smoking status: Never Smoker  . Smokeless tobacco: Never Used  Substance Use Topics  . Alcohol use: Yes    Alcohol/week: 0.0 standard drinks  . Drug use: No    Review of Systems Per HPI unless specifically indicated above     Objective:    BP 135/81   Pulse 67   Temp (!) 97.5 F (36.4 C) (Temporal)   Resp 16   Ht 5\' 4"  (1.626 m)   Wt 224 lb (101.6 kg)   BMI 38.45 kg/m   Wt Readings from Last 3 Encounters:  06/04/19 224 lb (101.6 kg)  03/19/19 222 lb (100.7 kg)    09/19/18 217 lb (98.4 kg)    Physical Exam Vitals and nursing note reviewed.  Constitutional:      General: She is not in acute distress.    Appearance: She is well-developed. She is obese. She is not diaphoretic.     Comments: Well-appearing, comfortable, cooperative  HENT:     Head: Normocephalic and atraumatic.  Eyes:     General:        Right eye: No discharge.        Left eye: No discharge.     Conjunctiva/sclera: Conjunctivae normal.  Cardiovascular:     Rate and Rhythm: Normal rate.  Pulmonary:     Effort: Pulmonary effort is normal.  Musculoskeletal:     Comments: Bilateral Feet Inspection: Pes planus loss of arches medial with standing on bare feet, bilateral great toe bunions apparent, only slight deviation of great to outwardly Palpation: non tender great toes and medial arch or plantar fascia, heel or achilles tendon - normal ROM: full active ROM Strength: 5/5 Neurovascular: distal intact   Skin:    General: Skin is warm and dry.     Findings: No erythema or rash.  Neurological:  Mental Status: She is alert and oriented to person, place, and time.  Psychiatric:        Behavior: Behavior normal.     Comments: Well groomed, good eye contact, normal speech and thoughts    Results for orders placed or performed in visit on 03/18/19  Hemoglobin A1c  Result Value Ref Range   Hgb A1c MFr Bld 6.0 (H) <5.7 % of total Hgb   Mean Plasma Glucose 126 (calc)   eAG (mmol/L) 7.0 (calc)      Assessment & Plan:   Problem List Items Addressed This Visit    Flat foot   Relevant Orders   Ambulatory referral to Podiatry   Chronic pain of both feet - Primary   Relevant Medications   meloxicam (MOBIC) 15 MG tablet   Other Relevant Orders   Ambulatory referral to Podiatry   Bilateral bunions   Relevant Orders   Ambulatory referral to Podiatry      Clinically consistent with chronic bilateral foot pain seems medial arch pes planus related and bunions likely  contributing with abnormal foot architecture - Uncertain if possible plantar fasciitis is contributing factor - history is not suggestive of this condition given her symptoms worse with overuse and prolong use, rather than AM symptoms classic for plantar fascia - Risk with obesity and prolonged time on feet. Lack of foot support in shoes - No known injury. Unlikely fracture based on history, no bony tenderness. - Limited conservative therapy   Plan: 1. Reviewed diagnosis and management of chronic foot pain, pes planus / bunions etc 2. Defer medications today will continue OTC therapy PRN 3. Referral to [facility] for further evaluation, likely would receive X-rays and further diagnostic advice, may warrant custom orthotics for medial arch support 4. May use topical muscle rub, ice 5. Emphasized importance of relative rest, ice, and avoid prolonged standing and overuse 6. Handout given and explained appropriate stretching and home exercises important to do first thing in morning and later  No orders of the defined types were placed in this encounter.  Orders Placed This Encounter  Procedures  . Ambulatory referral to Podiatry    Referral Priority:   Routine    Referral Type:   Consultation    Referral Reason:   Specialty Services Required    Requested Specialty:   Podiatry    Number of Visits Requested:   1      Follow up plan: Return if symptoms worsen or fail to improve, for foot pain.   Saralyn Pilar, DO [facility] 06/04/2019, 11:45 AM

## 2019-06-04 NOTE — Patient Instructions (Addendum)
Thank you for coming to the office today.  [facility] Address: [address], [place], [place] [address] Hours: Open 8AM-5PM Phone: [phone]  Likely pes planus or flat feet, but can be mild bunion issue, probably overuse concern - may need x-rays orthotics and other treatment.  Less likely plantar fasciitis  Please schedule a Follow-up Appointment to: Return if symptoms worsen or fail to improve, for foot pain.  If you have any other questions or concerns, please feel free to call the office or send a message through MyChart. You may also schedule an earlier appointment if necessary.  Additionally, you may be receiving a survey about your experience at our office within a few days to 1 week by e-mail or mail. We value your feedback.  Saralyn Pilar, DO [facility], [facility]              Arch Pain Rehabilitation Exercises   Stretching: You may begin exercising the muscles of your foot right away by gently stretching them with the towel stretch. When the towel stretch becomes too easy, you may begin doing the standing calf stretch and plantar fascia stretch.  Towel stretch: Sit on a hard surface with your injured leg stretched out in front of you. Loop a towel around the ball of your foot and pull the towel toward your body keeping your knee straight. Hold this position for 15 to 30 seconds then relax. Repeat 3 times. Standing calf stretch: Facing a wall, put your hands against the wall at about eye level. Keep the injured leg back, the uninjured leg forward, and the heel of your injured leg on the floor. Turn your injured foot slightly inward (as if you were pigeon-toed) as you slowly lean into the wall until you feel a stretch in the back of your calf. Hold for 15 to 30 seconds. Repeat 3 times. Do this exercise several times each day. When you can stand comfortably on your injured foot, you can begin stretching the plantar fascia at the  bottom of your foot.  Plantar fascia stretch: Stand with the ball of your injured foot on a stair. Reach for the bottom step with your heel until you feel a stretch in the arch of your foot. Hold this position for 15 to 30 seconds and then relax. Repeat 3 times. Static and dynamic balance exercises  Place a chair next to your non-injured leg and stand upright. (This will provide you with balance if needed.) Stand on your injured foot. Try to raise the arch of your foot while keeping your toes on the floor. Try to maintain this position and balance on your injured side for 30 seconds. This exercise can be made more difficult by doing it on a piece of foam or a pillow, or with your eyes closed.  Stand in the same position as above. Keep your foot in this position and reach forward in front of you with your injured side's hand, allowing your knee to bend. Repeat this 10 times while maintaining the arch height. This exercise can be made more difficult by reaching farther in front of you. Do 2 sets.  Stand in the same position as above. While maintaining your arch height, reach the injured side's hand across your body toward the chair. The farther you reach, the more challenging the exercise. Do 2 sets of 10.  Towel pickup: With your heel on the ground, pick up a towel with your toes. Release. Repeat 10 to 20  times. When this gets easy, add more resistance by placing a book or small weight on the towel.  Frozen can roll: Roll your bare injured foot back and forth from your heel to your mid-arch over a frozen juice can. Repeat for 3 to 5 minutes. This exercise is particularly helpful if done first thing in the morning. Resisted dorsiflexion: Sit with your injured leg out straight and your foot facing a doorway. Tie a loop in one end of the tubing. Put your foot through the loop so that the tubing goes around the arch of your foot. Tie a knot in the other end of the tubing and shut the knot in the door. Move  backward until there is tension in the tubing. Keeping your knee straight, pull your foot toward your body, stretching the tubing. Slowly return to the starting position. Do 3 sets of 10.  Next, you can begin strengthening the muscles of your foot and lower leg by doing the rest of the exercises.  Strengthening Resisted plantar flexion: Sit with your leg outstretched and loop the middle section of the tubing around the ball of your foot. Hold the ends of the tubing in both hands. Gently press the ball of your foot down and point your toes, stretching the tubing. Return to the starting position. Do 3 sets of 10.  Resisted inversion: Sit with your legs out straight and cross your uninjured leg over your injured ankle. Wrap the tubing around the ball of your injured foot and then loop it around your uninjured foot so that the tubing is anchored there at one end. Hold the other end of the tubing in your hand. Turn your injured foot inward and upward. This will stretch the tubing. Return to the starting position. Do 3 sets of 10.  Resisted eversion: Sit with both legs stretched out in front of you, with your feet about a shoulder's width apart. Tie a loop in one end of the tubing. Put your injured foot through the loop so that the tubing goes around the arch of that foot and wraps around the outside of the uninjured foot. Hold onto the other end of the tubing with your hand to provide tension. Turn your injured foot up and out. Make sure you keep your uninjured foot still so that it will allow the tubing to stretch as you move your injured foot. Return to the starting position. Do 3 sets of 10.     ----------------------------------------------------------------------------------                 Plantar Fascia Stretches / Exercises  See other page with pictures of each exercise.  Start with 1 or 2 of these exercises that you are most comfortable with. Do not do any exercises that cause  you significant worsening pain. Some of these may cause some "stretching soreness" but it should go away after you stop the exercise, and get better over time. Gradually increase up to 3-4 exercises as tolerated.  You may begin exercising the muscles of your foot right away by gently stretching them as follows:  Stretching: Towel stretch: Sit on a hard surface with your injured leg stretched out in front of you. Loop a towel around the ball of your foot and pull the towel toward your body keeping your knee straight. Hold this position for 15 to 30 seconds then relax. Repeat 3 times. When the towel stretch becomes to easy, you may begin doing the standing calf stretch.  Standing calf  stretch: Facing a wall, put your hands against the wall at about eye level. Keep the injured leg back, the uninjured leg forward, and the heel of your injured leg on the floor. Turn your injured foot slightly inward (as if you were pigeon-toed) as you slowly lean into the wall until you feel a stretch in the back of your calf. Hold for 15 to 30 seconds. Repeat 3 times. Do this exercise several times each day. When you can stand comfortably on your injured foot, you can begin stretching the bottom of your foot using the plantar fascia stretch.  Plantar fascia stretch: Stand with the ball of your injured foot on a stair. Reach for the bottom step with your heel until you feel a stretch in the arch of your foot. Hold this position for 15 to 30 seconds and then relax. Repeat 3 times. After you have stretched the bottom muscles of your foot, you can begin strengthening the top muscles of your foot.  Frozen can roll: Roll your bare injured foot back and forth from your heel to your mid-arch over a frozen juice can. Repeat for 3 to 5 minutes. This exercise is particularly helpful if done first thing in the morning. Towel pickup: With your heel on the ground, pick up a towel with your toes. Release. Repeat 10 to 20 times. When  this gets easy, add more resistance by placing a book or small weight on the towel. Static and dynamic balance exercises Place a chair next to your non-injured leg and stand upright. (This will provide you with balance if needed.) Stand on your injured foot. Try to raise the arch of your foot while keeping your toes on the floor. Try to maintain this position and balance on your injured side for 30 seconds. This exercise can be made more difficult by doing it on a piece of foam or a pillow, or with your eyes closed. Stand in the same position as above. Keep your foot in this position and reach forward in front of you with your injured side's hand, allowing your knee to bend. Repeat this 10 times while maintaining the arch height. This exercise can be made more difficult by reaching farther in front of you. Do 2 sets. Stand in the same position as above. While maintaining your arch height, reach the injured side's hand across your body toward the chair. The farther you reach, the more challenging the exercise. Do 2 sets of 10.  Next, you can begin strengthening the muscles of your foot and lower leg by using elastic tubing.  Strengthening: Resisted dorsiflexion: Sit with your injured leg out straight and your foot facing a doorway. Tie a loop in one end of the tubing. Put your foot through the loop so that the tubing goes around the arch of your foot. Tie a knot in the other end of the tubing and shut the knot in the door. Move backward until there is tension in the tubing. Keeping your knee straight, pull your foot toward your body, stretching the tubing. Slowly return to the starting position. Do 3 sets of 10. Resisted plantar flexion: Sit with your leg outstretched and loop the middle section of the tubing around the ball of your foot. Hold the ends of the tubing in both hands. Gently press the ball of your foot down and point your toes, stretching the tubing. Return to the starting position. Do 3 sets  of 10. Resisted inversion: Sit with your legs out straight and  cross your uninjured leg over your injured ankle. Wrap the tubing around the ball of your injured foot and then loop it around your uninjured foot so that the tubing is anchored there at one end. Hold the other end of the tubing in your hand. Turn your injured foot inward and upward. This will stretch the tubing. Return to the starting position. Do 3 sets of 10. Resisted eversion: Sit with both legs stretched out in front of you, with your feet about a shoulder's width apart. Tie a loop in one end of the tubing. Put your injured foot through the loop so that the tubing goes around the arch of that foot and wraps around the outside of the uninjured foot. Hold onto the other end of the tubing with your hand to provide tension. Turn your injured foot up and out. Make sure you keep your uninjured foot still so that it will allow the tubing to stretch as you move your injured foot. Return to the starting position. Do 3 sets of 10.   ------------------------------------------------------------------------------

## 2019-06-11 ENCOUNTER — Ambulatory Visit: Payer: BC Managed Care – PPO | Admitting: Podiatry

## 2019-06-28 ENCOUNTER — Other Ambulatory Visit: Payer: Self-pay | Admitting: Family Medicine

## 2019-06-28 DIAGNOSIS — B372 Candidiasis of skin and nail: Secondary | ICD-10-CM

## 2019-07-09 ENCOUNTER — Ambulatory Visit (INDEPENDENT_AMBULATORY_CARE_PROVIDER_SITE_OTHER): Payer: BC Managed Care – PPO

## 2019-07-09 ENCOUNTER — Other Ambulatory Visit: Payer: Self-pay

## 2019-07-09 ENCOUNTER — Ambulatory Visit: Payer: BC Managed Care – PPO | Admitting: Podiatry

## 2019-07-09 ENCOUNTER — Encounter: Payer: Self-pay | Admitting: Podiatry

## 2019-07-09 VITALS — Temp 98.7°F

## 2019-07-09 DIAGNOSIS — M2011 Hallux valgus (acquired), right foot: Secondary | ICD-10-CM | POA: Diagnosis not present

## 2019-07-09 DIAGNOSIS — M2010 Hallux valgus (acquired), unspecified foot: Secondary | ICD-10-CM

## 2019-07-09 DIAGNOSIS — M2012 Hallux valgus (acquired), left foot: Secondary | ICD-10-CM

## 2019-07-09 DIAGNOSIS — M21611 Bunion of right foot: Secondary | ICD-10-CM

## 2019-07-09 DIAGNOSIS — M21612 Bunion of left foot: Secondary | ICD-10-CM

## 2019-07-09 NOTE — Progress Notes (Signed)
   HPI: 49 y.o. female presenting today as a new patient for evaluation of bilateral bunion deformity left greater than the right.  Patient has been dealing with this for several years off and on.  Aggravated by certain shoes.  Today they are very tender to touch.  Patient reports acute flareups approximately 2 times per month.  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   Physical Exam: General: The patient is alert and oriented x3 in no acute distress.  Dermatology: Skin is warm, dry and supple bilateral lower extremities. Negative for open lesions or macerations.  Vascular: Palpable pedal pulses bilaterally. No edema or erythema noted. Capillary refill within normal limits.  Neurological: Epicritic and protective threshold grossly intact bilaterally.   Musculoskeletal Exam: Range of motion within normal limits to all pedal and ankle joints bilateral. Muscle strength 5/5 in all groups bilateral.  Clinical evidence of bunion deformity with pain on palpation to the hypertrophic medial eminence of the first metatarsal heads.  Pain with range of motion noted as well  Radiographic Exam:  Normal osseous mineralization. Joint spaces preserved. No fracture/dislocation/boney destruction.  Increased intermetatarsal angle with a increased hallux valgus angle consistent with bunion deformity bilateral  Assessment: 1.  Bilateral bunions: Left greater than right   Plan of Care:  1. Patient evaluated. X-Rays reviewed.  2.  Today we discussed conservative versus surgical management of bunion deformities.  Patient would like to have surgery performed this fall.  Surgery was discussed in detail as well as postoperative care. 3.  Message was sent to our billing specialist to reach out to the patient regarding financial obligation 4.  Return to clinic as needed for surgical consult when the patient is ready  for surgery  *Part-time at [employer] home improvement.  Full-time for the government secretarial position      Felecia Shelling, DPM [facility]  Dr. Felecia Shelling, DPM    [address] [place], [place] [address]                Office [phone]  Fax [phone]

## 2019-08-14 ENCOUNTER — Other Ambulatory Visit: Payer: Self-pay

## 2019-08-14 ENCOUNTER — Telehealth (INDEPENDENT_AMBULATORY_CARE_PROVIDER_SITE_OTHER): Payer: BC Managed Care – PPO | Admitting: Family Medicine

## 2019-08-14 ENCOUNTER — Encounter: Payer: Self-pay | Admitting: Family Medicine

## 2019-08-14 DIAGNOSIS — J0100 Acute maxillary sinusitis, unspecified: Secondary | ICD-10-CM

## 2019-08-14 MED ORDER — IPRATROPIUM BROMIDE 0.06 % NA SOLN: 2.0000 | Freq: Four times a day (QID) | NASAL | 0 refills | Status: DC

## 2019-08-14 MED ORDER — BENZONATATE 100 MG PO CAPS: 100.0000 mg | ORAL_CAPSULE | Freq: Three times a day (TID) | ORAL | 0 refills | Status: DC | PRN

## 2019-08-14 MED ORDER — AMOXICILLIN-POT CLAVULANATE 875-125 MG PO TABS: 1.0000 | ORAL_TABLET | Freq: Two times a day (BID) | ORAL | 0 refills | Status: DC

## 2019-08-14 NOTE — Progress Notes (Signed)
Subjective:    Patient ID: Caroline Wang, female    DOB: [birth year], 49 y.o.   MRN: [id number]  Caroline Wang is a 49 y.o. female presenting on 08/14/2019 for Sinusitis (yellowish mucus, cough, facial pressure, HA denies fever --onset week and half)  Virtual / Telehealth Encounter - Video Visit via MyChart The purpose of this virtual visit is to provide medical care while limiting exposure to the novel coronavirus (COVID19) for both patient and office staff.  Consent was obtained for remote visit:  Yes.   Answered questions that patient had about telehealth interaction:  Yes.   I discussed the limitations, risks, security and privacy concerns of performing an evaluation and management service by video/telephone. I also discussed with the patient that there may be a patient responsible charge related to this service. The patient expressed understanding and agreed to proceed.  Patient Location: Home Provider Location: [facility] (Office)   HPI   Sinusitis, Acute Chronic Allergic Rhinitis / Seasonal Allergies Known history of seasonal environmental allergies, on Singulair and Allegra daily, in past did not improve with nasal steroid or nasal anti histamine. No recent sinus infections or antibiotics - Now today, reports symptoms onset 1.5 week with sinus symptoms, initially more sinus symptoms, seems to have worsened and progressed to cough. Symptoms most bothersome are the coughing of productive thicker phlegm - Tried sinus OTC Tylenol DayQuil and NyQuil and Fexofenadine. UTD COVID19 Vaccine, on chart. No known sick contacts Denies any fevers chills, sweats, nausea vomiting chest pain dyspnea, wheezing  Depression screen [facility] 2/9 03/19/2019 03/08/2018 05/02/2017  Decreased Interest 0 0 0  Down, Depressed, Hopeless 0 0 0  PHQ - 2 Score 0 0 0  Altered sleeping - - 0  Tired, decreased energy - - 0  Change in appetite - - 0  Feeling bad or failure about yourself  - - 0    Trouble concentrating - - 0  Moving slowly or fidgety/restless - - 0  Suicidal thoughts - - 0  PHQ-9 Score - - 0  Difficult doing work/chores - - Not difficult at all    Social History   Tobacco Use  . Smoking status: Never Smoker  . Smokeless tobacco: Never Used  Substance Use Topics  . Alcohol use: Yes    Alcohol/week: 0.0 standard drinks  . Drug use: No    Review of Systems Per HPI unless specifically indicated above     Objective:    There were no vitals taken for this visit.  Wt Readings from Last 3 Encounters:  06/04/19 224 lb (101.6 kg)  03/19/19 222 lb (100.7 kg)  09/19/18 217 lb (98.4 kg)    Physical Exam   Note examination was completely remotely via video observation objective data only  Gen - well-appearing, no acute distress or apparent pain, comfortable HEENT - eyes appear clear without discharge or redness - appears congested. Heart/Lungs - cannot examine virtually - observed no evidence of coughing or labored breathing. Skin - face visible today- no rash Neuro - awake, alert, oriented Psych - not anxious appearing   Results for orders placed or performed in visit on 03/18/19  Hemoglobin A1c  Result Value Ref Range   Hgb A1c MFr Bld 6.0 (H) <5.7 % of total Hgb   Mean Plasma Glucose 126 (calc)   eAG (mmol/L) 7.0 (calc)      Assessment & Plan:   Problem List Items Addressed This Visit    None  Visit Diagnoses    Acute non-recurrent maxillary sinusitis    -  Primary   Relevant Medications   amoxicillin-clavulanate (AUGMENTIN) 875-125 MG tablet   benzonatate (TESSALON) 100 MG capsule   ipratropium (ATROVENT) 0.06 % nasal spray      Consistent with acute frontal vs maxillary sinusitis, likely initially viral URI vs allergic rhinitis component with worsening concern for bacterial infection.  Chronic seasonal environmental allergies complicating  Plan: 1 Start Augmentin 875-125mg  PO BID x 10 days 2. Start Atrovent nasal spray  decongestant 2 sprays in each nostril up to 4 times daily for 7 days 3. Start Tessalon Perls take 1 capsule up to 3 times a day as needed for cough 4. Continue Fexofenadine, Singulair Return criteria reviewed - future consider albuterol / prednisone for cough if indicated.   Meds ordered this encounter  Medications  . amoxicillin-clavulanate (AUGMENTIN) 875-125 MG tablet    Sig: Take 1 tablet by mouth 2 (two) times daily. For 10 days    Dispense:  20 tablet    Refill:  0  . benzonatate (TESSALON) 100 MG capsule    Sig: Take 1 capsule (100 mg total) by mouth 3 (three) times daily as needed for cough.    Dispense:  30 capsule    Refill:  0  . ipratropium (ATROVENT) 0.06 % nasal spray    Sig: Place 2 sprays into both nostrils 4 (four) times daily. For up to 5-7 days then stop.    Dispense:  15 mL    Refill:  0      Follow up plan: Return in about 1 week (around 08/21/2019), or if symptoms worsen or fail to improve, for sinusitis.    Patient verbalizes understanding with the above medical recommendations including the limitation of remote medical advice.  Specific follow-up and call-back criteria were given for patient to follow-up or seek medical care more urgently if needed.  Total duration of direct patient care provided via video conference : 10 minutes   Saralyn Pilar, DO [facility] 08/14/2019, 8:54 AM

## 2019-08-14 NOTE — Patient Instructions (Addendum)
  1. It sounds like you have a Sinusitis (Bacterial Infection) - this most likely started as an Upper Respiratory Virus that has settled into an infection. Allergies can also cause this. - Start Augmentin 1 pill twice daily (breakfast and dinner, with food and plenty of water) for 10 days, complete entire course, do not stop early even if feeling better - Start Atrovent nasal spray decongestant 2 sprays in each nostril up to 4 times daily for 7 days - Start Tessalon Perls take 1 capsule up to 3 times a day as needed for cough  Keep on allergy med OTC fexofenadine, Singulair  Recommend MUCINEX to clear congestion out of chest  Recommend to keep using Nasal Saline spray multiple times a day to help flush out congestion and clear sinuses - Improve hydration by drinking plenty of clear fluids (water, gatorade) to reduce secretions and thin congestion - Congestion draining down throat can cause irritation. May try warm herbal tea with honey, cough drops - Can take Tylenol or Ibuprofen as needed for fevers - May continue over the counter cold medicine as you are, I would not use any decongestant or mucinex longer than 7 days.  If you develop persistent fever >101F for at least 3 consecutive days, headaches with sinus pain or pressure or persistent earache, please schedule a follow-up evaluation within next few days to week.   Please schedule a Follow-up Appointment to: Return in about 1 week (around 08/21/2019), or if symptoms worsen or fail to improve, for sinusitis.  If you have any other questions or concerns, please feel free to call the office or send a message through MyChart. You may also schedule an earlier appointment if necessary.  Additionally, you may be receiving a survey about your experience at our office within a few days to 1 week by e-mail or mail. We value your feedback.  Saralyn Pilar, DO [facility], [place]

## 2019-08-22 ENCOUNTER — Other Ambulatory Visit: Payer: Self-pay | Admitting: Family Medicine

## 2019-08-22 DIAGNOSIS — J0100 Acute maxillary sinusitis, unspecified: Secondary | ICD-10-CM

## 2019-08-22 NOTE — Telephone Encounter (Signed)
Requested medication (s) are due for refill today:   Yes  Requested medication (s) are on the active medication list:   Yes  Future visit scheduled:   No  Seen a wk ago   Last ordered: 08/14/2019 15 ml, RF 0  No protocol assigned to this medication   Requested Prescriptions  Pending Prescriptions Disp Refills   ipratropium (ATROVENT) 0.06 % nasal spray [Pharmacy Med Name: IPRATROPIUM 0.06% NAS SP (165)] 15 mL 0    Sig: USE 2 SPRAYS IN EACH NOSTRIL FOUR TIMES DAILY FOR UP TO 5 TO 7 DAYS THEN STOP      Off-Protocol Failed - 08/22/2019  3:27 AM      Failed - Medication not assigned to a protocol, review manually.      Passed - Valid encounter within last 12 months    Recent Outpatient Visits           1 week ago Acute non-recurrent maxillary sinusitis   [facility] [place], Netta Neat, DO   2 months ago Chronic pain of both feet   [facility] [place], [place] Neat, DO   5 months ago Elevated hemoglobin A1c   [facility] [place], [place]   11 months ago Annual physical exam   [facility] [place], Netta Neat, DO   1 year ago Elevated hemoglobin A1c   [facility] [place], [place]             Off-Protocol Failed - 08/22/2019  3:27 AM      Failed - Medication not assigned to a protocol, review manually.      Passed - Valid encounter within last 12 months    Recent Outpatient Visits           1 week ago Acute non-recurrent maxillary sinusitis   [facility] [place], Netta Neat, DO   2 months ago Chronic pain of both feet   [facility] [place], Netta Neat, DO   5 months ago Elevated hemoglobin A1c   [facility] [place], [place]   11 months ago Annual physical exam   [facility] [place], Netta Neat, DO   1 year ago Elevated hemoglobin A1c   [facility]  [place], Netta Neat, [place]

## 2019-08-29 ENCOUNTER — Other Ambulatory Visit: Payer: Self-pay | Admitting: Family Medicine

## 2019-08-29 DIAGNOSIS — J0100 Acute maxillary sinusitis, unspecified: Secondary | ICD-10-CM

## 2019-09-04 ENCOUNTER — Other Ambulatory Visit: Payer: Self-pay | Admitting: Family Medicine

## 2019-09-04 DIAGNOSIS — IMO0002 Reserved for concepts with insufficient information to code with codable children: Secondary | ICD-10-CM

## 2019-09-04 DIAGNOSIS — G43709 Chronic migraine without aura, not intractable, without status migrainosus: Secondary | ICD-10-CM

## 2019-09-04 NOTE — Telephone Encounter (Signed)
Requested medication (s) are due for refill today: Yes  Requested medication (s) are on the active medication list: Yes  Last refill:  09/19/18  Future visit scheduled: No  Notes to clinic:  Prescription expires next month.    Requested Prescriptions  Pending Prescriptions Disp Refills   AIMOVIG 70 MG/ML SOAJ [Pharmacy Med Name: AIMOVIG SURECLICK 70MG /ML 1 AUTOINJ] 1 mL     Sig: ADMINISTER 1 ML( 70 MG) UNDER THE SKIN EVERY 30 DAYS      Off-Protocol Failed - 09/04/2019  8:00 AM      Failed - Medication not assigned to a protocol, review manually.      Passed - Valid encounter within last 12 months    Recent Outpatient Visits           3 weeks ago Acute non-recurrent maxillary sinusitis   [facility] [place], [place], [place]   3 months ago Chronic pain of both feet   [facility] [place], [place], [place]   5 months ago Elevated hemoglobin A1c   [facility], [place]   11 months ago Annual physical exam   [facility] [place], [place], [place]   1 year ago Elevated hemoglobin A1c   [facility], DO             Off-Protocol Failed - 09/04/2019  8:00 AM      Failed - Medication not assigned to a protocol, review manually.      Passed - Valid encounter within last 12 months    Recent Outpatient Visits           3 weeks ago Acute non-recurrent maxillary sinusitis   [facility] [place], [place], [place]   3 months ago Chronic pain of both feet   [facility] [place], [place], [place]   5 months ago Elevated hemoglobin A1c   [facility], [place]   11 months ago Annual physical exam   [facility] [place], [place], DO   1 year ago Elevated hemoglobin A1c   [facility], [place], Netta Neat

## 2019-10-11 ENCOUNTER — Ambulatory Visit: Payer: BC Managed Care – PPO | Admitting: Podiatry

## 2019-10-17 ENCOUNTER — Telehealth: Payer: Self-pay | Admitting: Family Medicine

## 2019-10-17 NOTE — Telephone Encounter (Signed)
Plan did approve her PA for Aimovig --[id number] for 12 month.

## 2019-10-29 NOTE — Telephone Encounter (Signed)
PT returning the call / would like call back

## 2019-10-29 NOTE — Telephone Encounter (Signed)
Pt. Called back to report her [facility] did not receive the prior authorization for her Aimovig. Please advise pt.

## 2019-10-29 NOTE — Telephone Encounter (Signed)
Attempted to contact pt; left message on voicemail. 

## 2019-10-30 ENCOUNTER — Other Ambulatory Visit: Payer: Self-pay | Admitting: Family Medicine

## 2019-10-30 NOTE — Telephone Encounter (Signed)
PA number given she will call the pharmacy.

## 2020-01-20 ENCOUNTER — Telehealth: Payer: Self-pay

## 2020-01-20 NOTE — Telephone Encounter (Signed)
Caroline Wang Emgality are both listed on the patient preferred medication list.

## 2020-01-20 NOTE — Telephone Encounter (Signed)
Please contact patient to find out more information.  If she changed insurance plans or the insurance formulary list has changed, then it may no longer be covered.  I reviewed the chart - back in 02/2018 - Completed PA Form for Aimovig 70mg  monthly injection.  Dx G43.709 Chronic migraine  Meds tried and failed: Topamax (failed due to side effect, cognitive/confusion 2010), also failed Midodrine, Wellbutrin (ineffective 2010)  She has been approved on it in past since 02/2018.  This would be continuation of therapy.  If the insurance prefers a DIFFERENT medication of this same class (Emgality, Ajovy) then we need to know this formulary list from the patient so we can order a different med and get it approved.  03/2018, DO [facility] 01/20/2020, 4:03 PM

## 2020-01-20 NOTE — Telephone Encounter (Signed)
Copied from CRM [id number]. Topic: General - Other >> Jan 20, 2020  2:52 PM Marylen Ponto wrote: Reason for CRM: Pt stated she received a letter from her insurance stating as of 01/18/20 they will not cover Rx for AIMOVIG 70 MG/ML SOAJ. Pt stated she will be responsible for the entire cost and she can not afford over $700 per month. Pt requests call back

## 2020-01-20 NOTE — Telephone Encounter (Signed)
Attempted to contact the patient, no answer. I left a detail message on her voicemail. I will also reply to the patient mychart message.

## 2020-04-09 ENCOUNTER — Other Ambulatory Visit: Payer: Self-pay | Admitting: Family Medicine

## 2020-04-09 NOTE — Telephone Encounter (Signed)
Requested medication (s) are due for refill today: yes  Requested medication (s) are on the active medication list: yes  Last refill: 09/04/19  36ml  5 refills  Future visit scheduled no  Notes to clinic: off protocol  Requested Prescriptions  Pending Prescriptions Disp Refills   AIMOVIG 70 MG/ML SOAJ [Pharmacy Med Name: AIMOVIG SURECLICK 70MG /ML 1 AUTOINJ] 1 mL 5    Sig: ADMINISTER 1 ML(70 MG) UNDER THE SKIN EVERY 30 DAYS      Off-Protocol Failed - 04/09/2020  8:00 AM      Failed - Medication not assigned to a protocol, review manually.      Passed - Valid encounter within last 12 months    Recent Outpatient Visits           7 months ago Acute non-recurrent maxillary sinusitis   [facility] [place], [place], [place]   10 months ago Chronic pain of both feet   [facility] [place], [place], [place]   1 year ago Elevated hemoglobin A1c   [facility], DO   1 year ago Annual physical exam   [facility], DO   2 years ago Elevated hemoglobin A1c   [facility], DO               Off-Protocol Failed - 04/09/2020  8:00 AM      Failed - Medication not assigned to a protocol, review manually.      Passed - Valid encounter within last 12 months    Recent Outpatient Visits           7 months ago Acute non-recurrent maxillary sinusitis   [facility] [place], [place], [place]   10 months ago Chronic pain of both feet   [facility] [place], [place], [place]   1 year ago Elevated hemoglobin A1c   [facility], DO   1 year ago Annual physical exam   [facility] [place], [place], [place]   2 years ago Elevated hemoglobin A1c   [facility] [place], [place], Netta Neat

## 2020-04-30 ENCOUNTER — Other Ambulatory Visit: Payer: Self-pay | Admitting: Family Medicine

## 2020-04-30 DIAGNOSIS — Z1231 Encounter for screening mammogram for malignant neoplasm of breast: Secondary | ICD-10-CM

## 2020-04-30 DIAGNOSIS — J309 Allergic rhinitis, unspecified: Secondary | ICD-10-CM

## 2020-04-30 NOTE — Telephone Encounter (Signed)
   Notes to clinic:  this script has expired  Review for refill    Requested Prescriptions  Pending Prescriptions Disp Refills   montelukast (SINGULAIR) 10 MG tablet [Pharmacy Med Name: MONTELUKAST 10MG  TABLETS] 90 tablet 3    Sig: TAKE 1 TABLET BY MOUTH AT BEDTIME      Pulmonology:  Leukotriene Inhibitors Passed - 04/30/2020  8:54 AM      Passed - Valid encounter within last 12 months    Recent Outpatient Visits           8 months ago Acute non-recurrent maxillary sinusitis   [facility], DO   11 months ago Chronic pain of both feet   [facility] [place], [place], [place]   1 year ago Elevated hemoglobin A1c   [facility], DO   1 year ago Annual physical exam   [facility] [place], [place], [place]   2 years ago Elevated hemoglobin A1c   [facility] [place], [place], Netta Neat

## 2020-05-07 ENCOUNTER — Ambulatory Visit
Admission: RE | Admit: 2020-05-07 | Discharge: 2020-05-07 | Disposition: A | Discharge status: Home / Self Care | Payer: BC Managed Care – PPO | Source: Ambulatory Visit | Attending: Family Medicine | Admitting: Family Medicine

## 2020-05-07 ENCOUNTER — Other Ambulatory Visit: Payer: Self-pay

## 2020-05-07 ENCOUNTER — Ambulatory Visit: Payer: BC Managed Care – PPO | Admitting: Family Medicine

## 2020-05-07 DIAGNOSIS — Z1231 Encounter for screening mammogram for malignant neoplasm of breast: Secondary | ICD-10-CM | POA: Insufficient documentation

## 2020-05-15 ENCOUNTER — Ambulatory Visit: Payer: Self-pay | Admitting: Family Medicine

## 2020-06-30 ENCOUNTER — Telehealth: Payer: BC Managed Care – PPO | Admitting: Emergency Medicine

## 2020-06-30 DIAGNOSIS — R21 Rash and other nonspecific skin eruption: Secondary | ICD-10-CM

## 2020-06-30 NOTE — Progress Notes (Signed)
E Visit for Rash  We are sorry that you are not feeling well. Here is how we plan to help!  It's hard to say what's causing this rash.  I would recommend putting you on a steroid taper for the next few days.  If that doesn't work, you should be seen in person by your doctor.    HOME CARE:  Take cool showers and avoid direct sunlight. Apply cool compress or wet dressings. Take a bath in an oatmeal bath.  Sprinkle content of one Aveeno packet under running faucet with comfortably warm water.  Bathe for 15-20 minutes, 1-2 times daily.  Pat dry with a towel. Do not rub the rash. Use hydrocortisone cream. Take an antihistamine like Benadryl for widespread rashes that itch.  The adult dose of Benadryl is 25-50 mg by mouth 4 times daily. Caution:  This type of medication may cause sleepiness.  Do not drink alcohol, drive, or operate dangerous machinery while taking antihistamines.  Do not take these medications if you have prostate enlargement.  Read package instructions thoroughly on all medications that you take.  GET HELP RIGHT AWAY IF:  Symptoms don't go away after treatment. Severe itching that persists. If you rash spreads or swells. If you rash begins to smell. If it blisters and opens or develops a yellow-brown crust. You develop a fever. You have a sore throat. You become short of breath.  MAKE SURE YOU:  Understand these instructions. Will watch your condition. Will get help right away if you are not doing well or get worse.  Thank you for choosing an e-visit. Your e-visit answers were reviewed by a board certified advanced clinical practitioner to complete your personal care plan. Depending upon the condition, your plan could have included both over the counter or prescription medications. Please review your pharmacy choice. Be sure that the pharmacy you have chosen is open so that you can pick up your prescription now.  If there is a problem you may message your provider in  MyChart to have the prescription routed to another pharmacy. Your safety is important to [place]. If you have drug allergies check your prescription carefully.  For the next 24 hours, you can use MyChart to ask questions about today's visit, request a non-urgent call back, or ask for a work or school excuse from your e-visit provider. You will get an email in the next two days asking about your experience. I hope that your e-visit has been valuable and will speed your recovery.    Approximately 5 minutes was used in reviewing the patient's chart, questionnaire, prescribing medications, and documentation.

## 2020-07-15 ENCOUNTER — Ambulatory Visit: Payer: Self-pay | Admitting: Family Medicine

## 2020-07-27 ENCOUNTER — Other Ambulatory Visit: Payer: Self-pay | Admitting: Family Medicine

## 2020-07-27 DIAGNOSIS — J309 Allergic rhinitis, unspecified: Secondary | ICD-10-CM

## 2020-07-30 ENCOUNTER — Encounter: Payer: Self-pay | Admitting: Family Medicine

## 2020-07-30 ENCOUNTER — Other Ambulatory Visit: Payer: Self-pay

## 2020-07-30 ENCOUNTER — Ambulatory Visit: Payer: BC Managed Care – PPO | Admitting: Family Medicine

## 2020-07-30 VITALS — BP 139/83 | HR 60 | Ht 63.5 in | Wt 218.4 lb

## 2020-07-30 DIAGNOSIS — L308 Other specified dermatitis: Secondary | ICD-10-CM

## 2020-07-30 NOTE — Progress Notes (Signed)
Subjective:    Patient ID: Caroline Wang, female    DOB: [birth year], 50 y.o.   MRN: [id number]  Caroline Wang is a 50 y.o. female presenting on 07/30/2020 for Rash   HPI  Eczema / Rash 1 year ago has had issues with rash on face around corners of mouth. Doesn't itch or burn, has not resolved from prior treatment Now past few months, mostly itching rash arms has dry pale fine papular rash flexural and other surfaces and also involving chest. Improved w/ some eczema body wash OTC oatmeal product with good results Questioning food allergy or other causes Has not seen Dermatology Denies any redness, drainage, ulceration, worse itching  Depression screen [facility] 2/9 03/19/2019 03/08/2018 05/02/2017  Decreased Interest 0 0 0  Down, Depressed, Hopeless 0 0 0  PHQ - 2 Score 0 0 0  Altered sleeping - - 0  Tired, decreased energy - - 0  Change in appetite - - 0  Feeling bad or failure about yourself  - - 0  Trouble concentrating - - 0  Moving slowly or fidgety/restless - - 0  Suicidal thoughts - - 0  PHQ-9 Score - - 0  Difficult doing work/chores - - Not difficult at all    Social History   Tobacco Use   Smoking status: Never   Smokeless tobacco: Never  Substance Use Topics   Alcohol use: Yes    Alcohol/week: 0.0 standard drinks   Drug use: No    Review of Systems Per HPI unless specifically indicated above     Objective:    BP 139/83   Pulse 60   Ht 5' 3.5" (1.613 m)   Wt 218 lb 6.4 oz (99.1 kg)   SpO2 100%   BMI 38.08 kg/m   Wt Readings from Last 3 Encounters:  07/30/20 218 lb 6.4 oz (99.1 kg)  06/04/19 224 lb (101.6 kg)  03/19/19 222 lb (100.7 kg)    Physical Exam Vitals and nursing note reviewed.  Constitutional:      General: She is not in acute distress.    Appearance: Normal appearance. She is well-developed. She is not diaphoretic.     Comments: Well-appearing, comfortable, cooperative  HENT:     Head: Normocephalic and atraumatic.  Eyes:     General:         Right eye: No discharge.        Left eye: No discharge.     Conjunctiva/sclera: Conjunctivae normal.  Cardiovascular:     Rate and Rhythm: Normal rate.  Pulmonary:     Effort: Pulmonary effort is normal.  Skin:    General: Skin is warm and dry.     Findings: No erythema or rash.  Neurological:     Mental Status: She is alert and oriented to person, place, and time.  Psychiatric:        Mood and Affect: Mood normal.        Behavior: Behavior normal.        Thought Content: Thought content normal.     Comments: Well groomed, good eye contact, normal speech and thoughts   Results for orders placed or performed in visit on 03/18/19  Hemoglobin A1c  Result Value Ref Range   Hgb A1c MFr Bld 6.0 (H) <5.7 % of total Hgb   Mean Plasma Glucose 126 (calc)   eAG (mmol/L) 7.0 (calc)      Assessment & Plan:   Problem List Items Addressed This Visit   None  Visit Diagnoses     Other eczema    -  Primary   Relevant Orders   Ambulatory referral to Dermatology       Clinically with eczema type rash on upper extremity and trunk Has failed most OTC conservative therapy including corticosteroid course Improved on some remedy, washes / topical moisturizer, however still has persistent problem and has occasional itching. Offer Hydroxyzine or other options PRN, declines for now  Referral to [facility] D[facility]ology for further management, give duration of condition and limited success.  No orders of the defined types were placed in this encounter.     Follow up plan: Return if symptoms worsen or fail to improve.   Caroline Pilar, DO [facility] 07/30/2020, 11:01 AM

## 2020-07-30 NOTE — Patient Instructions (Addendum)
Thank you for coming to the office today.  Referral to Dermatology - stay tuned for apt  [facility]   [address] [place], [place] [address] Hours: 8AM-5PM Phone: [phone]  [facility] Dermatologist in [place], [place] Address: [address], [place], [place] [address] Phone: [phone]  [facility] D[facility]ology [address] [place], [place] [address] Phone: [phone]  The rash looks most consistent with eczema, this can flare up and get worse due to a variety of factors (excessive dry skin from bathing/showering, soaps, cold weather / indoor heaters, outdoor exposures).  Tips to reduce Eczema Flares: For baths/showers, limit bathing to every other day if you can (max 1 x daily)  Use a gentle, unscented soap and lukewarm water (hot water is most irritating to skin) Never scrub skin with too much pressure, this causes more irritation. Pat skin dry, then leave it slightly damp. DO NOT scrub it dry. Apply steroid cream to skin and rub in all the way, wait 15 min, then apply a daily moisturizer (Vaseline, Eucerin, Aveeno). Continue daily moisturizer every day of the year (even after flare is resolved) - If you have eczema on hands or dry hands, recommend wearing any type of gloves overnight (cloth, fabric, or even nitrile/latex) to improve effect of topical moisturizer  If develops redness, honey colored crust oozing, drainage of pus, bleeding, or redness / swelling, pain, please return for re-evaluation, may have become infected after scratching.   Please schedule a Follow-up Appointment to: Return if symptoms worsen or fail to improve.  If you have any other questions or concerns, please feel free to call the office or send a message through MyChart. You may also schedule an earlier appointment if necessary.  Additionally, you may be receiving a survey about your experience at our office within a few days to 1 week by e-mail or mail. We value your  feedback.  Saralyn Pilar, DO [facility], [place]

## 2021-01-27 ENCOUNTER — Ambulatory Visit: Payer: BC Managed Care – PPO | Admitting: Dermatology

## 2021-05-05 ENCOUNTER — Other Ambulatory Visit: Payer: Self-pay | Admitting: Family Medicine

## 2021-05-05 DIAGNOSIS — J309 Allergic rhinitis, unspecified: Secondary | ICD-10-CM

## 2021-05-05 NOTE — Telephone Encounter (Signed)
Requested Prescriptions  ?Pending Prescriptions Disp Refills  ?? montelukast (SINGULAIR) 10 MG tablet [Pharmacy Med Name: MONTELUKAST 10MG  TABLETS] 90 tablet 3  ?  Sig: TAKE 1 TABLET BY MOUTH AT BEDTIME  ?  ? Pulmonology:  Leukotriene Inhibitors Passed - 05/05/2021  3:27 AM  ?  ?  Passed - Valid encounter within last 12 months  ?  Recent Outpatient Visits   ?      ? 9 months ago Other eczema  ? [facility] [place], [place], [place]  ? 1 year ago Acute non-recurrent maxillary sinusitis  ? [facility] [place], [place], [place]  ? 1 year ago Chronic pain of both feet  ? [facility] [place], [place], DO  ? 2 years ago Elevated hemoglobin A1c  ? [facility], [place] Charon, DO  ? 2 years ago Annual physical exam  ? [facility], DO  ?  ?  ? ?  ?  ?  ? ? ?

## 2021-06-07 ENCOUNTER — Other Ambulatory Visit: Payer: Self-pay | Admitting: Family Medicine

## 2021-06-07 DIAGNOSIS — Z1231 Encounter for screening mammogram for malignant neoplasm of breast: Secondary | ICD-10-CM

## 2021-06-09 ENCOUNTER — Encounter: Payer: Self-pay | Admitting: Family Medicine

## 2021-06-09 ENCOUNTER — Other Ambulatory Visit: Payer: Self-pay | Admitting: Family Medicine

## 2021-06-09 ENCOUNTER — Ambulatory Visit: Payer: BC Managed Care – PPO | Admitting: Family Medicine

## 2021-06-09 VITALS — BP 126/86 | HR 60 | Ht 63.5 in | Wt 207.8 lb

## 2021-06-09 DIAGNOSIS — E669 Obesity, unspecified: Secondary | ICD-10-CM

## 2021-06-09 DIAGNOSIS — L858 Other specified epidermal thickening: Secondary | ICD-10-CM

## 2021-06-09 DIAGNOSIS — Z0000 Encounter for general adult medical examination without abnormal findings: Secondary | ICD-10-CM

## 2021-06-09 DIAGNOSIS — L308 Other specified dermatitis: Secondary | ICD-10-CM

## 2021-06-09 DIAGNOSIS — Z1211 Encounter for screening for malignant neoplasm of colon: Secondary | ICD-10-CM

## 2021-06-09 DIAGNOSIS — Z13220 Encounter for screening for lipoid disorders: Secondary | ICD-10-CM

## 2021-06-09 DIAGNOSIS — R7309 Other abnormal glucose: Secondary | ICD-10-CM

## 2021-06-09 MED ORDER — TRIAMCINOLONE ACETONIDE 0.1 % EX CREA: 1.0000 "application " | TOPICAL_CREAM | Freq: Two times a day (BID) | CUTANEOUS | 2 refills | Status: AC

## 2021-06-09 MED ORDER — HYDROXYZINE HCL 25 MG PO TABS: 25.0000 mg | ORAL_TABLET | Freq: Two times a day (BID) | ORAL | 3 refills | Status: DC | PRN

## 2021-06-09 NOTE — Patient Instructions (Addendum)
Thank you for coming to the office today.  Call Dermatology try to schedule follow-up for the rash.  [facility] Dermatology Dermatologist in [place], [place] Address: [address], [place], [place] [address] Phone: [phone]  ----------------------------------------  Referral sent to [facility] - they will contact you with an apt to schedule.   [facility]) [address] [place], [place] [address] Phone: [phone]  [facility]) [address] [place], [place] [address] Main: [phone] ------------  The rash looks most consistent with eczema, this can flare up and get worse due to a variety of factors (excessive dry skin from bathing/showering, soaps, cold weather / indoor heaters, outdoor exposures).  Use the topical steroid creams twice a day for up to 1 week, maximum duration of use per one flare is 10 to 14 days, then STOP using it and allow skin to recover. Caution with over-use may cause lightening of the skin.   OTC Hydrocortisone on face only and the Triamcinolone / Kenalog on body only.  Tips to reduce Eczema Flares: For baths/showers, limit bathing to every other day if you can (max 1 x daily)  Use a gentle, unscented soap and lukewarm water (hot water is most irritating to skin) Never scrub skin with too much pressure, this causes more irritation. Pat skin dry, then leave it slightly damp. DO NOT scrub it dry. Apply steroid cream to skin and rub in all the way, wait 15 min, then apply a daily moisturizer (Vaseline, Eucerin, Aveeno). Continue daily moisturizer every day of the year (even after flare is resolved) - If you have eczema on hands or dry hands, recommend wearing any type of gloves overnight (cloth, fabric, or even nitrile/latex) to improve effect of topical moisturizer  If develops redness, honey colored crust oozing, drainage of pus, bleeding, or redness / swelling, pain, please  return for re-evaluation, may have become infected after scratching.  DUE for FASTING BLOOD WORK (no food or drink after midnight before the lab appointment, only water or coffee without cream/sugar on the morning of)  SCHEDULE "Lab Only" visit in the morning at the clinic for lab draw in 4 MONTHS   - Make sure Lab Only appointment is at about 1 week before your next appointment, so that results will be available  For Lab Results, once available within 2-3 days of blood draw, you can can log in to MyChart online to view your results and a brief explanation. Also, we can discuss results at next follow-up visit.    Please schedule a Follow-up Appointment to: Return in about 4 months (around 10/10/2021) for 4 month fasting lab only then 1 week later Annual Physical.  If you have any other questions or concerns, please feel free to call the office or send a message through MyChart. You may also schedule an earlier appointment if necessary.  Additionally, you may be receiving a survey about your experience at our office within a few days to 1 week by e-mail or mail. We value your feedback.  Saralyn Pilar, DO [facility], [place]

## 2021-06-09 NOTE — Progress Notes (Signed)
Subjective:    Patient ID: Caroline Wang, female    DOB: July 30, 1970, 51 y.o.   MRN: [id number]  Caroline Wang is a 51 y.o. female presenting on 06/09/2021 for Rash   HPI  Eczema, Atopic Dermatitis  Background history, 2021 reported symptoms initially with face involvement. Last visit with me 07/2020, she was referred to Dermatology. Evaluated by them and the rash had resolved She has done well overall for about a year, now recent recurrence of rash with raised bumps itchy on back of arms, spot on elbow flexor surface, and on face and abdomen.     03/19/2019    2:51 PM 03/08/2018    8:09 AM 05/02/2017    3:44 PM  Depression screen PHQ 2/9  Decreased Interest 0 0 0  Down, Depressed, Hopeless 0 0 0  PHQ - 2 Score 0 0 0  Altered sleeping   0  Tired, decreased energy   0  Change in appetite   0  Feeling bad or failure about yourself    0  Trouble concentrating   0  Moving slowly or fidgety/restless   0  Suicidal thoughts   0  PHQ-9 Score   0  Difficult doing work/chores   Not difficult at all    Social History   Tobacco Use   Smoking status: Never   Smokeless tobacco: Never  Substance Use Topics   Alcohol use: Yes    Alcohol/week: 0.0 standard drinks   Drug use: No    Review of Systems Per HPI unless specifically indicated above     Objective:    BP 126/86   Pulse 60   Ht 5' 3.5" (1.613 m)   Wt 207 lb 12.8 oz (94.3 kg)   SpO2 100%   BMI 36.23 kg/m   Wt Readings from Last 3 Encounters:  06/09/21 207 lb 12.8 oz (94.3 kg)  07/30/20 218 lb 6.4 oz (99.1 kg)  06/04/19 224 lb (101.6 kg)    Physical Exam Vitals and nursing note reviewed.  Constitutional:      General: She is not in acute distress.    Appearance: Normal appearance. She is well-developed. She is not diaphoretic.     Comments: Well-appearing, comfortable, cooperative  HENT:     Head: Normocephalic and atraumatic.  Eyes:     General:        Right eye: No discharge.        Left eye: No  discharge.     Conjunctiva/sclera: Conjunctivae normal.  Cardiovascular:     Rate and Rhythm: Normal rate.  Pulmonary:     Effort: Pulmonary effort is normal.  Skin:    General: Skin is warm and dry.     Findings: Rash (left arm flexor surface with dry patch consistent with eczema, arms posterior scattered keratosis pilaris, and raised papular rash on face concern for eczema) present. No erythema.  Neurological:     Mental Status: She is alert and oriented to person, place, and time.  Psychiatric:        Mood and Affect: Mood normal.        Behavior: Behavior normal.        Thought Content: Thought content normal.     Comments: Well groomed, good eye contact, normal speech and thoughts   Results for orders placed or performed in visit on 03/18/19  Hemoglobin A1c  Result Value Ref Range   Hgb A1c MFr Bld 6.0 (H) <5.7 % of total Hgb  Mean Plasma Glucose 126 (calc)   eAG (mmol/L) 7.0 (calc)      Assessment & Plan:   Problem List Items Addressed This Visit   None Visit Diagnoses     Other eczema    -  Primary   Relevant Medications   triamcinolone cream (KENALOG) 0.1 %   hydrOXYzine (ATARAX) 25 MG tablet   Screening for colon cancer       Relevant Orders   Ambulatory referral to Gastroenterology       Likely eczema atopic dermatitis rash on arms upper ext near elbow  Rx Triamcinolone 0.1% cream BID 1-2 weeks Use OTC hydrocortisone for face spots only  Call Dermatology try to schedule follow-up for the rash. Given recurrence  Note arms looks like keratosis pilaris  [facility] Dermatologist in [place], [place] Address: [address] [place],  [address] Phone: [phone]   Refer to GI for initial screening colonoscopy  Orders Placed This Encounter  Procedures   Ambulatory referral to Gastroenterology    Referral Priority:   Routine    Referral Type:   Consultation    Referral Reason:   Specialty Services Required    Number of Visits  Requested:   1     Meds ordered this encounter  Medications   triamcinolone cream (KENALOG) 0.1 %    Sig: Apply 1 application. topically 2 (two) times daily. For 1-2 weeks as needed for flares    Dispense:  30 g    Refill:  2   hydrOXYzine (ATARAX) 25 MG tablet    Sig: Take 1 tablet (25 mg total) by mouth 2 (two) times daily as needed for itching.    Dispense:  30 tablet    Refill:  3      Follow up plan: Return in about 4 months (around 10/10/2021) for 4 month fasting lab only then 1 week later Annual Physical.  Future labs ordered for 09/2021 CMET CBC LIPID A1c  Nobie Putnam, DO [facility] 06/09/2021, 9:59 AM

## 2021-06-10 ENCOUNTER — Other Ambulatory Visit: Payer: Self-pay

## 2021-06-10 ENCOUNTER — Telehealth: Payer: Self-pay

## 2021-06-10 ENCOUNTER — Ambulatory Visit: Payer: BC Managed Care – PPO | Admitting: Family Medicine

## 2021-06-10 DIAGNOSIS — Z1211 Encounter for screening for malignant neoplasm of colon: Secondary | ICD-10-CM

## 2021-06-10 MED ORDER — NA SULFATE-K SULFATE-MG SULF 17.5-3.13-1.6 GM/177ML PO SOLN: 1.0000 | Freq: Once | ORAL | 0 refills | Status: AC

## 2021-06-10 NOTE — Telephone Encounter (Signed)
Gastroenterology Pre-Procedure Review  Request Date: 06/28/21 Requesting Physician: Dr. Tobi Bastos  PATIENT REVIEW QUESTIONS: The patient responded to the following health history questions as indicated:    1. Are you having any GI issues? no 2. Do you have a personal history of Polyps? no 3. Do you have a family history of Colon Cancer or Polyps? no 4. Diabetes Mellitus? no 5. Joint replacements in the past 12 months?no 6. Major health problems in the past 3 months?no 7. Any artificial heart valves, MVP, or defibrillator?no    MEDICATIONS & ALLERGIES:    Patient reports the following regarding taking any anticoagulation/antiplatelet therapy:   Plavix, Coumadin, Eliquis, Xarelto, Lovenox, Pradaxa, Brilinta, or Effient? no Aspirin? no  Patient confirms/reports the following medications:  Current Outpatient Medications  Medication Sig Dispense Refill   ALLERGY RELIEF 180 MG tablet TAKE 1 TABLET BY MOUTH DAILY 90 tablet 0   hydrOXYzine (ATARAX) 25 MG tablet Take 1 tablet (25 mg total) by mouth 2 (two) times daily as needed for itching. 30 tablet 3   montelukast (SINGULAIR) 10 MG tablet TAKE 1 TABLET BY MOUTH AT BEDTIME 90 tablet 0   triamcinolone cream (KENALOG) 0.1 % Apply 1 application. topically 2 (two) times daily. For 1-2 weeks as needed for flares 30 g 2   No current facility-administered medications for this visit.    Patient confirms/reports the following allergies:  No Known Allergies  No orders of the defined types were placed in this encounter.   AUTHORIZATION INFORMATION Primary Insurance: 1D#: Group #:  Secondary Insurance: 1D#: Group #:  SCHEDULE INFORMATION: Date: 07/01/21 Time: Location: [facility]

## 2021-06-21 ENCOUNTER — Telehealth: Payer: Self-pay

## 2021-06-21 NOTE — Telephone Encounter (Signed)
Pt has requested to reschedule her 06/28/21 colonoscopy due to friends wedding.  Colonoscopy has been rescheduled to 07/12/21.  Trish in Endo has been notified.  Thanks, [place], [place]

## 2021-07-02 ENCOUNTER — Ambulatory Visit
Admission: RE | Admit: 2021-07-02 | Discharge: 2021-07-02 | Disposition: A | Discharge status: Home / Self Care | Payer: BC Managed Care – PPO | Source: Ambulatory Visit | Attending: Family Medicine | Admitting: Family Medicine

## 2021-07-02 DIAGNOSIS — Z1231 Encounter for screening mammogram for malignant neoplasm of breast: Secondary | ICD-10-CM | POA: Diagnosis present

## 2021-07-09 ENCOUNTER — Encounter: Payer: Self-pay | Admitting: Gastroenterology

## 2021-07-12 ENCOUNTER — Encounter
Admission: RE | Disposition: A | Discharge status: Home / Self Care | Payer: Self-pay | Source: Home / Self Care | Attending: Gastroenterology

## 2021-07-12 ENCOUNTER — Encounter: Payer: Self-pay | Admitting: Gastroenterology

## 2021-07-12 ENCOUNTER — Ambulatory Visit: Payer: BC Managed Care – PPO | Admitting: Registered Nurse

## 2021-07-12 ENCOUNTER — Ambulatory Visit
Admission: RE | Admit: 2021-07-12 | Discharge: 2021-07-12 | Disposition: A | Discharge status: Home / Self Care | Payer: BC Managed Care – PPO | Attending: Gastroenterology | Admitting: Gastroenterology

## 2021-07-12 DIAGNOSIS — Z1211 Encounter for screening for malignant neoplasm of colon: Secondary | ICD-10-CM | POA: Insufficient documentation

## 2021-07-12 DIAGNOSIS — Z9049 Acquired absence of other specified parts of digestive tract: Secondary | ICD-10-CM | POA: Insufficient documentation

## 2021-07-12 HISTORY — PX: COLONOSCOPY: SHX5424

## 2021-07-12 HISTORY — DX: Headache, unspecified: R51.9

## 2021-07-12 SURGERY — COLONOSCOPY
Anesthesia: General

## 2021-07-12 MED ORDER — PROPOFOL 10 MG/ML IV BOLUS
INTRAVENOUS | Status: DC | PRN
  Administered 2021-07-12: 30 mg via INTRAVENOUS
  Administered 2021-07-12: 100 mg via INTRAVENOUS

## 2021-07-12 MED ORDER — PROPOFOL 500 MG/50ML IV EMUL
INTRAVENOUS | Status: DC | PRN
  Administered 2021-07-12: 175 ug/kg/min via INTRAVENOUS

## 2021-07-12 MED ORDER — SODIUM CHLORIDE 0.9 % IV SOLN
INTRAVENOUS | Status: DC
  Administered 2021-07-12: 1000 mL via INTRAVENOUS

## 2021-07-12 MED ORDER — LIDOCAINE HCL (CARDIAC) PF 100 MG/5ML IV SOSY
PREFILLED_SYRINGE | INTRAVENOUS | Status: DC | PRN
  Administered 2021-07-12: 100 mg via INTRAVENOUS

## 2021-07-12 NOTE — H&P (Signed)
Wyline Mood, MD [address], [place], [place], [address] [address], [address], [place], [place], [address] Phone: [phone]  Fax: [phone]  Primary Care Physician:  Smitty Cords, DO   Pre-Procedure History & Physical: HPI:  Caroline Wang is a 51 y.o. female is here for an colonoscopy.   Past Medical History:  Diagnosis Date   Headache     Past Surgical History:  Procedure Laterality Date   CESAREAN SECTION     CHOLECYSTECTOMY     SHOULDER ARTHROSCOPY DISTAL CLAVICLE EXCISION AND OPEN ROTATOR CUFF REPAIR     TOTAL ABDOMINAL HYSTERECTOMY  02/2004   Total Abdominal including ovaries, cervix - [facility] Dr Talbert Cage    Prior to Admission medications   Medication Sig Start Date End Date Taking? Authorizing Provider  montelukast (SINGULAIR) 10 MG tablet TAKE 1 TABLET BY MOUTH AT BEDTIME 05/05/21  Yes Karamalegos, Netta Neat, DO  triamcinolone cream (KENALOG) 0.1 % Apply 1 application. topically 2 (two) times daily. For 1-2 weeks as needed for flares 06/09/21  Yes Smitty Cords, DO  ALLERGY RELIEF 180 MG tablet TAKE 1 TABLET BY MOUTH DAILY 07/27/20   Althea Charon, Netta Neat, DO  hydrOXYzine (ATARAX) 25 MG tablet Take 1 tablet (25 mg total) by mouth 2 (two) times daily as needed for itching. 06/09/21   Smitty Cords, DO    Allergies as of 06/10/2021   (No Known Allergies)    Family History  Problem Relation Age of Onset   Other Maternal Aunt 62       Pre-Diabetes   Breast cancer Neg Hx    Diabetes Neg Hx    Colon cancer Neg Hx     Social History   Socioeconomic History   Marital status: Single    Spouse name: Not on file   Number of children: 2   Years of education: Not on file   Highest education level: Not on file  Occupational History   Not on file  Tobacco Use   Smoking status: Never   Smokeless tobacco: Never  Vaping Use   Vaping Use: Never used  Substance and Sexual  Activity   Alcohol use: Yes    Alcohol/week: 0.0 standard drinks of alcohol   Drug use: No   Sexual activity: Not on file  Other Topics Concern   Not on file  Social History Narrative   Not on file   Social Determinants of Health   Financial Resource Strain: Not on file  Food Insecurity: Not on file  Transportation Needs: Not on file  Physical Activity: Not on file  Stress: Not on file  Social Connections: Not on file  Intimate Partner Violence: Not on file    Review of Systems: See HPI, otherwise negative ROS  Physical Exam: BP 122/78   Pulse (!) 56   Temp (!) 97.3 F (36.3 C) (Temporal)   Resp 18   Ht 5\' 4"  (1.626 m)   Wt 90.9 kg   SpO2 100%   BMI 34.40 kg/m  General:   Alert,  pleasant and cooperative in NAD Head:  Normocephalic and atraumatic. Neck:  Supple; no masses or thyromegaly. Lungs:  Clear throughout to auscultation, normal respiratory effort.    Heart:  +S1, +S2, Regular rate and rhythm, No edema. Abdomen:  Soft, nontender and nondistended. Normal bowel sounds, without guarding, and without rebound.   Neurologic:  Alert and  oriented x4;  grossly normal neurologically.  Impression/Plan:  Caroline Wang is here for an colonoscopy to be performed for Screening colonoscopy average risk   Risks, benefits, limitations, and alternatives regarding  colonoscopy have been reviewed with the patient.  Questions have been answered.  All parties agreeable.   Wyline Mood, MD  07/12/2021, 10:34 AM

## 2021-07-13 ENCOUNTER — Encounter: Payer: Self-pay | Admitting: Gastroenterology

## 2021-07-28 ENCOUNTER — Ambulatory Visit: Payer: BC Managed Care – PPO | Admitting: Family Medicine

## 2021-07-28 ENCOUNTER — Other Ambulatory Visit: Payer: Self-pay | Admitting: Family Medicine

## 2021-07-28 ENCOUNTER — Encounter: Payer: Self-pay | Admitting: Family Medicine

## 2021-07-28 VITALS — BP 143/80 | HR 63 | Ht 64.0 in | Wt 204.6 lb

## 2021-07-28 DIAGNOSIS — J0110 Acute frontal sinusitis, unspecified: Secondary | ICD-10-CM | POA: Diagnosis not present

## 2021-07-28 DIAGNOSIS — J3089 Other allergic rhinitis: Secondary | ICD-10-CM

## 2021-07-28 DIAGNOSIS — J309 Allergic rhinitis, unspecified: Secondary | ICD-10-CM

## 2021-07-28 MED ORDER — AMOXICILLIN-POT CLAVULANATE 875-125 MG PO TABS: 1.0000 | ORAL_TABLET | Freq: Two times a day (BID) | ORAL | 0 refills | Status: DC

## 2021-07-28 MED ORDER — FEXOFENADINE HCL 180 MG PO TABS: 180.0000 mg | ORAL_TABLET | Freq: Every day | ORAL | 3 refills | Status: DC

## 2021-07-28 MED ORDER — IPRATROPIUM BROMIDE 0.06 % NA SOLN: 2.0000 | Freq: Four times a day (QID) | NASAL | 0 refills | Status: DC

## 2021-07-28 MED ORDER — BENZONATATE 100 MG PO CAPS: 100.0000 mg | ORAL_CAPSULE | Freq: Three times a day (TID) | ORAL | 0 refills | Status: DC | PRN

## 2021-07-28 NOTE — Telephone Encounter (Signed)
Discontinued today at [facility]. Requested Prescriptions  Pending Prescriptions Disp Refills  . ALLERGY RELIEF 180 MG tablet [Pharmacy Med Name: FEXOFENADINE 180MG  TABLETS (OTC)] 90 tablet 0    Sig: TAKE 1 TABLET BY MOUTH DAILY     Ear, Nose, and Throat:  Antihistamines Passed - 07/28/2021  1:24 PM      Passed - Valid encounter within last 12 months    Recent Outpatient Visits          Today Acute non-recurrent frontal sinusitis   [facility], DO   1 month ago Other eczema   [facility], DO   12 months ago Other eczema   [facility], DO   1 year ago Acute non-recurrent maxillary sinusitis   [facility], DO   2 years ago Chronic pain of both feet   [facility] [place] [place], DO      Future Appointments            In 2 months Netta Neat, Althea Charon, DO [facility], [facility]

## 2021-07-28 NOTE — Progress Notes (Signed)
Subjective:    Patient ID: Caroline Wang, female    DOB: 03/14/1970, 51 y.o.   MRN: [id number]  Caroline Wang is a 51 y.o. female presenting on 07/28/2021 for Cough and Sinusitis   HPI  Sinusitis Reports symptoms onset this past weekend with hoarse voice laryngitis, no other significant symptoms, sinus drainage was clear and felt okay, then she developed yellow sputum and thickening congestion drainage. She called to schedule apt early in the week She is taking Benadryl allergy at night and sudafed during day. Previous history similar sinusitis  Denies any fever chills dyspnea, wheezing      07/28/2021    2:14 PM 06/09/2021    1:46 PM 03/19/2019    2:51 PM  Depression screen PHQ 2/9  Decreased Interest 1 0 0  Down, Depressed, Hopeless 0 0 0  PHQ - 2 Score 1 0 0  Altered sleeping 1 0   Tired, decreased energy 1 0   Change in appetite 0 0   Feeling bad or failure about yourself  0 0   Trouble concentrating 0 0   Moving slowly or fidgety/restless 0 0   Suicidal thoughts 0 0   PHQ-9 Score 3 0   Difficult doing work/chores Not difficult at all Not difficult at all     Social History   Tobacco Use   Smoking status: Never   Smokeless tobacco: Never  Vaping Use   Vaping Use: Never used  Substance Use Topics   Alcohol use: Yes    Alcohol/week: 0.0 standard drinks of alcohol   Drug use: No    Review of Systems Per HPI unless specifically indicated above     Objective:    BP (!) 143/80   Pulse 63   Ht 5\' 4"  (1.626 m)   Wt 204 lb 9.6 oz (92.8 kg)   SpO2 100%   BMI 35.12 kg/m   Wt Readings from Last 3 Encounters:  07/28/21 204 lb 9.6 oz (92.8 kg)  07/12/21 200 lb 6.4 oz (90.9 kg)  06/09/21 207 lb 12.8 oz (94.3 kg)    Physical Exam Vitals and nursing note reviewed.  Constitutional:      General: She is not in acute distress.    Appearance: Normal appearance. She is well-developed. She is not diaphoretic.     Comments: Well-appearing, comfortable, cooperative   HENT:     Head: Normocephalic and atraumatic.  Eyes:     General:        Right eye: No discharge.        Left eye: No discharge.     Conjunctiva/sclera: Conjunctivae normal.  Neck:     Thyroid: No thyromegaly.  Cardiovascular:     Rate and Rhythm: Normal rate and regular rhythm.     Heart sounds: Normal heart sounds. No murmur heard. Pulmonary:     Effort: Pulmonary effort is normal. No respiratory distress.     Breath sounds: Normal breath sounds. No wheezing or rales.  Musculoskeletal:        General: Normal range of motion.     Cervical back: Normal range of motion and neck supple.  Lymphadenopathy:     Cervical: No cervical adenopathy.  Skin:    General: Skin is warm and dry.     Findings: No erythema or rash.  Neurological:     Mental Status: She is alert and oriented to person, place, and time.  Psychiatric:        Mood and Affect: Mood normal.  Behavior: Behavior normal.        Thought Content: Thought content normal.     Comments: Well groomed, good eye contact, normal speech and thoughts      Results for orders placed or performed in visit on 03/18/19  Hemoglobin A1c  Result Value Ref Range   Hgb A1c MFr Bld 6.0 (H) <5.7 % of total Hgb   Mean Plasma Glucose 126 (calc)   eAG (mmol/L) 7.0 (calc)      Assessment & Plan:   Problem List Items Addressed This Visit   None Visit Diagnoses     Acute non-recurrent frontal sinusitis    -  Primary   Relevant Medications   amoxicillin-clavulanate (AUGMENTIN) 875-125 MG tablet   benzonatate (TESSALON) 100 MG capsule   ipratropium (ATROVENT) 0.06 % nasal spray   fexofenadine (ALLEGRA) 180 MG tablet   Environmental and seasonal allergies       Relevant Medications   fexofenadine (ALLEGRA) 180 MG tablet       Consistent with acute frontal sinusitis, likely initially viral URI vs allergic rhinitis component with worsening concern for bacterial infection.   Plan: 1.  Start Augmentin 875-125mg  PO BID x 10  days 2. Start Atrovent nasal spray decongestant 2 sprays in each nostril up to 4 times daily for 7 days 3. Start Tessalon Perls take 1 capsule up to 3 times a day as needed for cough  Use current allergy meds Return criteria reviewed   Meds ordered this encounter  Medications   amoxicillin-clavulanate (AUGMENTIN) 875-125 MG tablet    Sig: Take 1 tablet by mouth 2 (two) times daily.    Dispense:  20 tablet    Refill:  0   benzonatate (TESSALON) 100 MG capsule    Sig: Take 1 capsule (100 mg total) by mouth 3 (three) times daily as needed for cough.    Dispense:  30 capsule    Refill:  0   ipratropium (ATROVENT) 0.06 % nasal spray    Sig: Place 2 sprays into both nostrils 4 (four) times daily. For up to 5-7 days then stop.    Dispense:  15 mL    Refill:  0   fexofenadine (ALLEGRA) 180 MG tablet    Sig: Take 1 tablet (180 mg total) by mouth daily.    Dispense:  90 tablet    Refill:  3      Follow up plan: Return in about 2 months (around 09/28/2021) for 2-3 months for fasting lab only then 1 week later Annual Physical.  Future labs already ordered for Sept 2023  Saralyn Pilar, DO [facility] 07/28/2021, 2:18 PM

## 2021-07-28 NOTE — Patient Instructions (Addendum)
Thank you for coming to the office today.  Start Augmentin Start Atrovent nasal spray decongestant 2 sprays in each nostril up to 4 times daily for 7 days Start Tessalon Perls take 1 capsule up to 3 times a day as needed for cough   DUE for FASTING BLOOD WORK (no food or drink after midnight before the lab appointment, only water or coffee without cream/sugar on the morning of)  SCHEDULE "Lab Only" visit in the morning at the clinic for lab draw in 2-3 MONTHS   - Make sure Lab Only appointment is at about 1 week before your next appointment, so that results will be available  For Lab Results, once available within 2-3 days of blood draw, you can can log in to MyChart online to view your results and a brief explanation. Also, we can discuss results at next follow-up visit.   Please schedule a Follow-up Appointment to: Return in about 2 months (around 09/28/2021) for 2-3 months for fasting lab only then 1 week later Annual Physical.  If you have any other questions or concerns, please feel free to call the office or send a message through MyChart. You may also schedule an earlier appointment if necessary.  Additionally, you may be receiving a survey about your experience at our office within a few days to 1 week by e-mail or mail. We value your feedback.  Saralyn Pilar, DO [facility], [place]

## 2021-07-29 ENCOUNTER — Other Ambulatory Visit: Payer: Self-pay | Admitting: Family Medicine

## 2021-07-29 DIAGNOSIS — J0110 Acute frontal sinusitis, unspecified: Secondary | ICD-10-CM

## 2021-07-29 NOTE — Telephone Encounter (Signed)
Requested medication (s) are due for refill today - no  Requested medication (s) are on the active medication list -yes  Future visit scheduled -yes  Last refill: 07/28/21  Notes to clinic: Duplicate request-Rx filled by provider yesterday- medication not assigned protocol  Requested Prescriptions  Pending Prescriptions Disp Refills   amoxicillin-clavulanate (AUGMENTIN) 875-125 MG tablet [Pharmacy Med Name: AMOX-CLAV 875-125MG  TABLETS] 20 tablet 0    Sig: TAKE 1 TABLET BY MOUTH TWICE DAILY FOR 10 DAYS     Off-Protocol Failed - 07/29/2021  3:27 AM      Failed - Medication not assigned to a protocol, review manually.      Passed - Valid encounter within last 12 months    Recent Outpatient Visits           Yesterday Acute non-recurrent frontal sinusitis   [facility] [place], Netta Neat, DO   1 month ago Other eczema   [facility] Smitty Cords, DO   12 months ago Other eczema   [facility] Smitty Cords, DO   1 year ago Acute non-recurrent maxillary sinusitis   [facility], Netta Neat, DO   2 years ago Chronic pain of both feet   [facility], Netta Neat, DO       Future Appointments             In 2 months Althea Charon, Netta Neat, DO [facility], [facility]               Requested Prescriptions  Pending Prescriptions Disp Refills   amoxicillin-clavulanate (AUGMENTIN) 875-125 MG tablet [Pharmacy Med Name: AMOX-CLAV 875-125MG  TABLETS] 20 tablet 0    Sig: TAKE 1 TABLET BY MOUTH TWICE DAILY FOR 10 DAYS     Off-Protocol Failed - 07/29/2021  3:27 AM      Failed - Medication not assigned to a protocol, review manually.      Passed - Valid encounter within last 12 months    Recent Outpatient Visits           Yesterday Acute non-recurrent frontal sinusitis   [facility] [place], Netta Neat, DO   1 month  ago Other eczema   [facility] Smitty Cords, DO   12 months ago Other eczema   [facility] Smitty Cords, DO   1 year ago Acute non-recurrent maxillary sinusitis   [facility] [place], Netta Neat, DO   2 years ago Chronic pain of both feet   [facility] [place], Netta Neat, DO       Future Appointments             In 2 months Althea Charon, Netta Neat, DO [facility], [place] [facility]

## 2021-08-04 ENCOUNTER — Other Ambulatory Visit: Payer: Self-pay | Admitting: Family Medicine

## 2021-08-04 DIAGNOSIS — J0110 Acute frontal sinusitis, unspecified: Secondary | ICD-10-CM

## 2021-08-05 NOTE — Telephone Encounter (Signed)
Requested medication (s) are due for refill today: no  Requested medication (s) are on the active medication list: yes  Last refill:  07/28/21  Future visit scheduled: yes  Notes to clinic:  Unable to refill per protocol, Sig states to stop medication after 5-7 days. Last refill 07/28/21, routing for approval.     Requested Prescriptions  Pending Prescriptions Disp Refills   ipratropium (ATROVENT) 0.06 % nasal spray [Pharmacy Med Name: IPRATROPIUM 0.06% NAS SP (165)] 15 mL 0    Sig: USE 2 SPRAYS IN EACH NOSTRIL FOUR TIMES DAILY FOR UP TO 5 TO 7 DAYS THEN STOP     Off-Protocol Failed - 08/04/2021  3:27 AM      Failed - Medication not assigned to a protocol, review manually.      Passed - Valid encounter within last 12 months    Recent Outpatient Visits           1 week ago Acute non-recurrent frontal sinusitis   [facility], Netta Neat, DO   1 month ago Other eczema   [facility], Netta Neat, DO   1 year ago Other eczema   [facility] Smitty Cords, DO   1 year ago Acute non-recurrent maxillary sinusitis   [facility] [place], Netta Neat, DO   2 years ago Chronic pain of both feet   [facility], Netta Neat, DO       Future Appointments             In 2 months Althea Charon, Netta Neat, DO [facility]           Off-Protocol Failed - 08/04/2021  3:27 AM      Failed - Medication not assigned to a protocol, review manually.      Passed - Valid encounter within last 12 months    Recent Outpatient Visits           1 week ago Acute non-recurrent frontal sinusitis   [facility] [place], Netta Neat, DO   1 month ago Other eczema   [facility] [place], Netta Neat, DO   1 year ago Other eczema   [facility] Smitty Cords, DO   1 year ago Acute  non-recurrent maxillary sinusitis   [facility] Smitty Cords, DO   2 years ago Chronic pain of both feet   [facility], Netta Neat, DO       Future Appointments             In 2 months Althea Charon, Netta Neat, DO [facility], [facility]

## 2021-08-12 ENCOUNTER — Other Ambulatory Visit: Payer: Self-pay | Admitting: Family Medicine

## 2021-08-12 DIAGNOSIS — J0110 Acute frontal sinusitis, unspecified: Secondary | ICD-10-CM

## 2021-08-12 NOTE — Telephone Encounter (Signed)
Requested Prescriptions  Pending Prescriptions Disp Refills  . ipratropium (ATROVENT) 0.06 % nasal spray [Pharmacy Med Name: IPRATROPIUM 0.06% NAS SP (165)] 15 mL 0    Sig: USE 2 SPRAYS IN EACH NOSTRIL FOUR TIMES DAILY AS NEEDED FOR RHINITIS     Off-Protocol Failed - 08/12/2021  3:27 AM      Failed - Medication not assigned to a protocol, review manually.      Passed - Valid encounter within last 12 months    Recent Outpatient Visits          2 weeks ago Acute non-recurrent frontal sinusitis   [facility] [place], Netta Neat, DO   2 months ago Other eczema   [facility] [place], Netta Neat, DO   1 year ago Other eczema   [facility] Smitty Cords, DO   1 year ago Acute non-recurrent maxillary sinusitis   [facility] [place], Netta Neat, DO   2 years ago Chronic pain of both feet   [facility] [place], Netta Neat, DO      Future Appointments            In 1 month Althea Charon, Netta Neat, DO [facility]          Off-Protocol Failed - 08/12/2021  3:27 AM      Failed - Medication not assigned to a protocol, review manually.      Passed - Valid encounter within last 12 months    Recent Outpatient Visits          2 weeks ago Acute non-recurrent frontal sinusitis   [facility] [place], Netta Neat, DO   2 months ago Other eczema   [facility] [place], Netta Neat, DO   1 year ago Other eczema   [facility] Smitty Cords, DO   1 year ago Acute non-recurrent maxillary sinusitis   [facility] Smitty Cords, DO   2 years ago Chronic pain of both feet   [facility] [place], Netta Neat, DO      Future Appointments            In 1 month Althea Charon, Netta Neat, DO [facility], [facility]

## 2021-09-28 ENCOUNTER — Other Ambulatory Visit: Payer: BC Managed Care – PPO

## 2021-09-29 ENCOUNTER — Other Ambulatory Visit: Payer: Self-pay

## 2021-09-29 DIAGNOSIS — Z0000 Encounter for general adult medical examination without abnormal findings: Secondary | ICD-10-CM

## 2021-09-29 DIAGNOSIS — Z13220 Encounter for screening for lipoid disorders: Secondary | ICD-10-CM

## 2021-09-29 DIAGNOSIS — E669 Obesity, unspecified: Secondary | ICD-10-CM

## 2021-09-29 DIAGNOSIS — R7309 Other abnormal glucose: Secondary | ICD-10-CM

## 2021-09-30 ENCOUNTER — Other Ambulatory Visit: Payer: BC Managed Care – PPO

## 2021-10-01 LAB — COMPLETE METABOLIC PANEL WITH GFR
AG Ratio: 1.3 (calc) (ref 1.0–2.5)
ALT: 20 U/L (ref 6–29)
AST: 20 U/L (ref 10–35)
Albumin: 4.2 g/dL (ref 3.6–5.1)
Alkaline phosphatase (APISO): 61 U/L (ref 37–153)
BUN: 14 mg/dL (ref 7–25)
CO2: 28 mmol/L (ref 20–32)
Calcium: 9.4 mg/dL (ref 8.6–10.4)
Chloride: 102 mmol/L (ref 98–110)
Creat: 0.93 mg/dL (ref 0.50–1.03)
Globulin: 3.2 g/dL (calc) (ref 1.9–3.7)
Glucose, Bld: 95 mg/dL (ref 65–99)
Potassium: 4 mmol/L (ref 3.5–5.3)
Sodium: 138 mmol/L (ref 135–146)
Total Bilirubin: 0.5 mg/dL (ref 0.2–1.2)
Total Protein: 7.4 g/dL (ref 6.1–8.1)
eGFR: 75 mL/min/{1.73_m2} (ref >=60)

## 2021-10-01 LAB — CBC WITH DIFFERENTIAL/PLATELET
Absolute Monocytes: 684 cells/uL (ref 200–950)
Basophils Absolute: 18 cells/uL (ref 0–200)
Basophils Relative: 0.3 %
Eosinophils Absolute: 112 cells/uL (ref 15–500)
Eosinophils Relative: 1.9 %
HCT: 36.2 % (ref 35.0–45.0)
Hemoglobin: 12.1 g/dL (ref 11.7–15.5)
Lymphs Abs: 1422 cells/uL (ref 850–3900)
MCH: 29.2 pg (ref 27.0–33.0)
MCHC: 33.4 g/dL (ref 32.0–36.0)
MCV: 87.2 fL (ref 80.0–100.0)
MPV: 10.5 fL (ref 7.5–12.5)
Monocytes Relative: 11.6 %
Neutro Abs: 3664 cells/uL (ref 1500–7800)
Neutrophils Relative %: 62.1 %
Platelets: 309 10*3/uL (ref 140–400)
RBC: 4.15 10*6/uL (ref 3.80–5.10)
RDW: 12.7 % (ref 11.0–15.0)
Total Lymphocyte: 24.1 %
WBC: 5.9 10*3/uL (ref 3.8–10.8)

## 2021-10-01 LAB — HEMOGLOBIN A1C
Hgb A1c MFr Bld: 5.7 % of total Hgb — ABNORMAL HIGH (ref <5.7)
Mean Plasma Glucose: 117 mg/dL
eAG (mmol/L): 6.5 mmol/L

## 2021-10-01 LAB — LIPID PANEL
Cholesterol: 192 mg/dL (ref <200)
HDL: 81 mg/dL (ref >=50)
LDL Cholesterol (Calc): 95 mg/dL (calc)
Non-HDL Cholesterol (Calc): 111 mg/dL (calc) (ref <130)
Total CHOL/HDL Ratio: 2.4 (calc) (ref <5.0)
Triglycerides: 70 mg/dL (ref <150)

## 2021-10-05 ENCOUNTER — Encounter: Payer: BC Managed Care – PPO | Admitting: Family Medicine

## 2021-10-07 ENCOUNTER — Encounter: Payer: BC Managed Care – PPO | Admitting: Family Medicine

## 2021-11-10 ENCOUNTER — Other Ambulatory Visit: Payer: Self-pay | Admitting: Family Medicine

## 2021-11-10 DIAGNOSIS — J309 Allergic rhinitis, unspecified: Secondary | ICD-10-CM

## 2021-11-10 NOTE — Telephone Encounter (Signed)
Requested Prescriptions  Pending Prescriptions Disp Refills  . montelukast (SINGULAIR) 10 MG tablet [Pharmacy Med Name: MONTELUKAST 10MG  TABLETS] 90 tablet 0    Sig: TAKE 1 TABLET BY MOUTH AT BEDTIME     Pulmonology:  Leukotriene Inhibitors Passed - 11/10/2021  3:27 AM      Passed - Valid encounter within last 12 months    Recent Outpatient Visits          3 months ago Acute non-recurrent frontal sinusitis   [facility] [place], Devonne Doughty, DO   5 months ago Other [facility] [place], Devonne Doughty, DO   1 year ago Other eczema   [facility] Olin Hauser, DO   2 years ago Acute non-recurrent maxillary sinusitis   [facility] Olin Hauser, DO   2 years ago Chronic pain of both feet   [facility] [place], Devonne Doughty, DO      Future Appointments            In 1 week [place], Devonne Doughty, [facility], [facility]

## 2021-11-22 ENCOUNTER — Ambulatory Visit: Payer: BC Managed Care – PPO | Admitting: Family Medicine

## 2021-11-22 ENCOUNTER — Encounter: Payer: Self-pay | Admitting: Family Medicine

## 2021-11-22 VITALS — BP 134/81 | HR 63 | Ht 64.0 in | Wt 214.2 lb

## 2021-11-22 DIAGNOSIS — N951 Menopausal and female climacteric states: Secondary | ICD-10-CM | POA: Diagnosis not present

## 2021-11-22 DIAGNOSIS — Z0000 Encounter for general adult medical examination without abnormal findings: Secondary | ICD-10-CM | POA: Diagnosis not present

## 2021-11-22 DIAGNOSIS — J309 Allergic rhinitis, unspecified: Secondary | ICD-10-CM

## 2021-11-22 MED ORDER — MONTELUKAST SODIUM 10 MG PO TABS: 10.0000 mg | ORAL_TABLET | Freq: Every day | ORAL | 3 refills | Status: DC

## 2021-11-22 NOTE — Progress Notes (Unsigned)
Subjective:    Patient ID: Caroline Wang, female    DOB: [birth year], 51 y.o.   MRN: [id number]  Caroline Wang is a 51 y.o. female presenting on 11/22/2021 for Annual Exam   HPI  Here for Annual Physical and Lab Review  Elevated A1c / Obesity BMI >36 Prior lab A1c 5.7 stable, she had elevated A1c 6.0 last year, now down to 5.7 again She does not have significant fam history of DM Meds: never on med before Lifestyle: Weight fluctuating, down from 2 years ago, but up from 6+ months - Diet (Some drinking sodas again, and not always adhering but trying to improve diet) - Exercise ( Denies hypoglycemia  Perimenopausal Vasomotor Hot Flashes Worse episodic hot flashes now, asking about medication options Her friend takes Estradiol asking about this.  Allergies Refill Singulair  MIgraines, improved Aimovig stopped  Health Maintenance: Future Shingles vaccine  Decline Flu Shot today     07/28/2021    2:14 PM 06/09/2021    1:46 PM 03/19/2019    2:51 PM  Depression screen PHQ 2/9  Decreased Interest 1 0 0  Down, Depressed, Hopeless 0 0 0  PHQ - 2 Score 1 0 0  Altered sleeping 1 0   Tired, decreased energy 1 0   Change in appetite 0 0   Feeling bad or failure about yourself  0 0   Trouble concentrating 0 0   Moving slowly or fidgety/restless 0 0   Suicidal thoughts 0 0   PHQ-9 Score 3 0   Difficult doing work/chores Not difficult at all Not difficult at all     Past Medical History:  Diagnosis Date   Headache    Past Surgical History:  Procedure Laterality Date   CESAREAN SECTION     CHOLECYSTECTOMY     COLONOSCOPY N/A 07/12/2021   Procedure: COLONOSCOPY;  Surgeon: Jonathon Bellows, MD;  Location: [facility] END[facility];  Service: Gastroenterology;  Laterality: N/A;   SHOULDER ARTHROSCOPY DISTAL CLAVICLE EXCISION AND OPEN ROTATOR CUFF REPAIR     TOTAL ABDOMINAL HYSTERECTOMY  02/2004   Total Abdominal including ovaries, cervix - [facility] Dr Annetta Maw    Social History   Socioeconomic History   Marital status: Single    Spouse name: Not on file   Number of children: 2   Years of education: Not on file   Highest education level: Not on file  Occupational History   Not on file  Tobacco Use   Smoking status: Never   Smokeless tobacco: Never  Vaping Use   Vaping Use: Never used  Substance and Sexual Activity   Alcohol use: Yes    Alcohol/week: 0.0 standard drinks of alcohol   Drug use: No   Sexual activity: Not on file  Other Topics Concern   Not on file  Social History Narrative   Not on file   Social Determinants of Health   Financial Resource Strain: Not on file  Food Insecurity: Not on file  Transportation Needs: Not on file  Physical Activity: Not on file  Stress: Not on file  Social Connections: Not on file  Intimate Partner Violence: Not on file   Family History  Problem Relation Age of Onset   Other Maternal Aunt 40       Pre-Diabetes   Breast cancer Neg Hx    Diabetes Neg Hx    Colon cancer Neg Hx    Current Outpatient Medications on File Prior to Visit  Medication Sig  fexofenadine (ALLEGRA) 180 MG tablet Take 1 tablet (180 mg total) by mouth daily.   ipratropium (ATROVENT) 0.06 % nasal spray USE 2 SPRAYS IN EACH NOSTRIL FOUR TIMES DAILY AS NEEDED FOR RHINITIS   triamcinolone cream (KENALOG) 0.1 % Apply 1 application. topically 2 (two) times daily. For 1-2 weeks as needed for flares   hydrOXYzine (ATARAX) 25 MG tablet Take 1 tablet (25 mg total) by mouth 2 (two) times daily as needed for itching. (Patient not taking: Reported on 11/22/2021)   No current facility-administered medications on file prior to visit.    Review of Systems  Constitutional:  Negative for activity change, appetite change, chills, diaphoresis, fatigue and fever.  HENT:  Negative for congestion and hearing loss.   Eyes:  Negative for visual disturbance.  Respiratory:  Negative for cough, chest tightness, shortness of breath and  wheezing.   Cardiovascular:  Negative for chest pain, palpitations and leg swelling.  Gastrointestinal:  Negative for abdominal pain, constipation, diarrhea, nausea and vomiting.  Genitourinary:  Negative for dysuria, frequency and hematuria.  Musculoskeletal:  Negative for arthralgias and neck pain.  Skin:  Negative for rash.  Neurological:  Negative for dizziness, weakness, light-headedness, numbness and headaches.  Hematological:  Negative for adenopathy.  Psychiatric/Behavioral:  Negative for behavioral problems, dysphoric mood and sleep disturbance.    Per HPI unless specifically indicated above      Objective:    BP 134/81   Pulse 63   Ht _0  (1.626 m)   Wt 214 lb 3.2 oz (97.2 kg)   SpO2 100%   BMI 36.77 kg/m   Wt Readings from Last 3 Encounters:  11/22/21 214 lb 3.2 oz (97.2 kg)  07/28/21 204 lb 9.6 oz (92.8 kg)  07/12/21 200 lb 6.4 oz (90.9 kg)    Physical Exam Vitals and nursing note reviewed.  Constitutional:      General: She is not in acute distress.    Appearance: She is well-developed. She is not diaphoretic.     Comments: Well-appearing, comfortable, cooperative  HENT:     Head: Normocephalic and atraumatic.  Eyes:     General:        Right eye: No discharge.        Left eye: No discharge.     Conjunctiva/sclera: Conjunctivae normal.     Pupils: Pupils are equal, round, and reactive to light.  Neck:     Thyroid: No thyromegaly.     Vascular: No carotid bruit.  Cardiovascular:     Rate and Rhythm: Normal rate and regular rhythm.     Pulses: Normal pulses.     Heart sounds: Normal heart sounds. No murmur heard. Pulmonary:     Effort: Pulmonary effort is normal. No respiratory distress.     Breath sounds: Normal breath sounds. No wheezing or rales.  Abdominal:     General: Bowel sounds are normal. There is no distension.     Palpations: Abdomen is soft. There is no mass.     Tenderness: There is no abdominal tenderness.  Musculoskeletal:         General: No tenderness. Normal range of motion.     Cervical back: Normal range of motion and neck supple.     Right lower leg: No edema.     Left lower leg: No edema.     Comments: Upper / Lower Extremities: - Normal muscle tone, strength bilateral upper extremities 5/5, lower extremities 5/5  Lymphadenopathy:     Cervical: No cervical adenopathy.  Skin:    General: Skin is warm and dry.     Findings: No erythema or rash.  Neurological:     Mental Status: She is alert and oriented to person, place, and time.     Comments: Distal sensation intact to light touch all extremities  Psychiatric:        Mood and Affect: Mood normal.        Behavior: Behavior normal.        Thought Content: Thought content normal.     Comments: Well groomed, good eye contact, normal speech and thoughts       Results for orders placed or performed in visit on 09/29/21  Hemoglobin A1c  Result Value Ref Range   Hgb A1c MFr Bld 5.7 (H) <5.7 % of total Hgb   Mean Plasma Glucose 117 mg/dL   eAG (mmol/L) 6.5 mmol/L  Lipid panel  Result Value Ref Range   Cholesterol 192 <200 mg/dL   HDL 81 > OR = 50 mg/dL   Triglycerides 70 <150 mg/dL   LDL Cholesterol (Calc) 95 mg/dL (calc)   Total CHOL/HDL Ratio 2.4 <5.0 (calc)   Non-HDL Cholesterol (Calc) 111 <130 mg/dL (calc)  CBC with Differential/Platelet  Result Value Ref Range   WBC 5.9 3.8 - 10.8 Thousand/uL   RBC 4.15 3.80 - 5.10 Million/uL   Hemoglobin 12.1 11.7 - 15.5 g/dL   HCT 36.2 35.0 - 45.0 %   MCV 87.2 80.0 - 100.0 fL   MCH 29.2 27.0 - 33.0 pg   MCHC 33.4 32.0 - 36.0 g/dL   RDW 12.7 11.0 - 15.0 %   Platelets 309 140 - 400 Thousand/uL   MPV 10.5 7.5 - 12.5 fL   Neutro Abs 3,664 1,500 - 7,800 cells/uL   Lymphs Abs 1,422 850 - 3,900 cells/uL   Absolute Monocytes 684 200 - 950 cells/uL   Eosinophils Absolute 112 15 - 500 cells/uL   Basophils Absolute 18 0 - 200 cells/uL   Neutrophils Relative % 62.1 %   Total Lymphocyte 24.1 %   Monocytes  Relative 11.6 %   Eosinophils Relative 1.9 %   Basophils Relative 0.3 %  COMPLETE METABOLIC PANEL WITH GFR  Result Value Ref Range   Glucose, Bld 95 65 - 99 mg/dL   BUN 14 7 - 25 mg/dL   Creat 0.93 0.50 - 1.03 mg/dL   eGFR 75 > OR = 60 mL/min/1.36m   BUN/Creatinine Ratio SEE NOTE: 6 - 22 (calc)   Sodium 138 135 - 146 mmol/L   Potassium 4.0 3.5 - 5.3 mmol/L   Chloride 102 98 - 110 mmol/L   CO2 28 20 - 32 mmol/L   Calcium 9.4 8.6 - 10.4 mg/dL   Total Protein 7.4 6.1 - 8.1 g/dL   Albumin 4.2 3.6 - 5.1 g/dL   Globulin 3.2 1.9 - 3.7 g/dL (calc)   AG Ratio 1.3 1.0 - 2.5 (calc)   Total Bilirubin 0.5 0.2 - 1.2 mg/dL   Alkaline phosphatase (APISO) 61 37 - 153 U/L   AST 20 10 - 35 U/L   ALT 20 6 - 29 U/L      Assessment & Plan:   Problem List Items Addressed This Visit     Chronic allergic rhinitis   Relevant Medications   montelukast (SINGULAIR) 10 MG tablet   Other Visit Diagnoses     Annual physical exam    -  Primary   Menopausal vasomotor syndrome  Updated Health Maintenance information Reviewed recent lab results with patient Encouraged improvement to lifestyle with diet and exercise Goal of weight loss AVS options on med assisted weight loss, she can f/u with me on these options, may consider short course Saxenda vs Contrave in addition to lifestyle modifications  Future Shingles vaccine, shingrix 2-6 months apart (2 doses), here or pharmacy  Hot flashes, vasomotor menopausal Recommend OTC herbal black cohosh Other options per AVS if needed - gabapentin, paxil, effexor, clonidine   Meds ordered this encounter  Medications   montelukast (SINGULAIR) 10 MG tablet    Sig: Take 1 tablet (10 mg total) by mouth at bedtime.    Dispense:  90 tablet    Refill:  3    Add refills for future      Follow up plan: Return in about 6 months (around 05/23/2022) for 6 month follow-up PreDM A1c, Weight, Hot flashes.  Nobie Putnam, [facility] 11/22/2021, 4:09 PM

## 2021-11-22 NOTE — Patient Instructions (Addendum)
Thank you for coming to the office today.  Future Shingles vaccine, shingrix 2-6 months apart (2 doses), here or pharmacy  Perimenopausal Hot Flashes.  Black Cohosh option for herbal remedy for hot flashes is a great starting option  If you need other med options in future let me know - Rx Gabapentin (bedtime, med, used for seizures and nerve pain, very safe, can be sedating, helps hot flashes) - Rx Paxil (anti depressant, has properties to help hot flashes, taken daily) - Rx Clonidine ( Bp medication, not great for BP it works very quickly but if used low dose at night for hot flashes, possible that it could work)  ---------- [employer] find cost and coverage of the following - check the following: - Drug Tier, Preferred List, On Formulary - All will require a "Prior Authorization" from [place] first, before you can find out the cost - Find out if there is "Step Therapy" (other medicines required before you can try these)  Once you pick the one you want to try, let me know - we can get a sample ready IF we have it in stock. Then try it - and before running out of medicine, contact me back to order your Rx so we have time to get it processed.  For Weight Loss / Obesity only  Wegovy (same as Ozempic) weekly injection - start 0.25mg  weekly, 1 dose per pen, single use, auto-injector - NOT COVERED  2. Saxenda - DAILY injection - start 0.6mg  injection DAILY, you can increase the dose by 1 notch or 0.6 mg per week, if you don't tolerate a dose, can reduce it the next day. - THIS OPTION MAY BE APPROVED once we get an authorization.  3. Contrave - oral medication, appetite suppression has wellbutrin/bupropion and naltrexone in it and it can also help with appetite, it is ordered through a speciality pharmacy. - $99 per month, regardless of insurance (company covers the additional cost)  4. Phentermine - oral medication, older generation med, "burning medication" speeds up heart / blood  pressure etc, but generally fairly well tolerated for short term. 1-3 months approximately. Affordable and generic.  Future make sure your insurance has weight loss coverage  WEIGHT MANAGEMENT  Dr Dennard Nip  [facility] [place], [place] [address] Ph: [phone]   Please schedule a Follow-up Appointment to: Return in about 6 months (around 05/23/2022) for 6 month follow-up PreDM A1c, Weight, Hot flashes.  If you have any other questions or concerns, please feel free to call the office or send a message through [place]. You may also schedule an earlier appointment if necessary.  Additionally, you may be receiving a survey about your experience at our office within a few days to 1 week by e-mail or mail. We value your feedback.  Nobie Putnam, DO [place]

## 2021-11-30 ENCOUNTER — Encounter: Payer: Self-pay | Admitting: Family Medicine

## 2021-11-30 DIAGNOSIS — E669 Obesity, unspecified: Secondary | ICD-10-CM

## 2021-11-30 DIAGNOSIS — R7303 Prediabetes: Secondary | ICD-10-CM

## 2021-11-30 MED ORDER — OZEMPIC (0.25 OR 0.5 MG/DOSE) 2 MG/1.5ML ~~LOC~~ SOPN: 0.2500 mg | PEN_INJECTOR | SUBCUTANEOUS | 3 refills | Status: DC

## 2021-12-21 NOTE — Addendum Note (Signed)
Addended by: Smitty Cords on: 12/21/2021 05:41 PM   Modules accepted: Orders

## 2021-12-22 MED ORDER — CONTRAVE 8-90 MG PO TB12: ORAL_TABLET | ORAL | 2 refills | Status: DC

## 2021-12-22 NOTE — Addendum Note (Signed)
Addended by: Smitty Cords on: 12/22/2021 08:16 AM   Modules accepted: Orders

## 2021-12-30 MED ORDER — SAXENDA 18 MG/3ML ~~LOC~~ SOPN: PEN_INJECTOR | SUBCUTANEOUS | 0 refills | Status: DC

## 2021-12-30 NOTE — Addendum Note (Signed)
Addended by: Smitty Cords on: 12/30/2021 01:26 PM   Modules accepted: Orders

## 2022-01-14 MED ORDER — PHENTERMINE HCL 37.5 MG PO CAPS: 37.5000 mg | ORAL_CAPSULE | ORAL | 0 refills | Status: DC

## 2022-01-14 NOTE — Addendum Note (Signed)
Addended by: Smitty Cords on: 01/14/2022 10:24 PM   Modules accepted: Orders

## 2022-02-19 ENCOUNTER — Other Ambulatory Visit: Payer: Self-pay | Admitting: Family Medicine

## 2022-02-19 DIAGNOSIS — E669 Obesity, unspecified: Secondary | ICD-10-CM

## 2022-02-21 ENCOUNTER — Other Ambulatory Visit: Payer: Self-pay | Admitting: Family Medicine

## 2022-02-21 DIAGNOSIS — E669 Obesity, unspecified: Secondary | ICD-10-CM

## 2022-02-21 NOTE — Telephone Encounter (Signed)
Requested medication (s) are due for refill today -yes  Requested medication (s) are on the active medication list -yes  Future visit scheduled -yes  Last refill: 01/14/22 #30  Notes to clinic: non delegated Rx  Requested Prescriptions  Pending Prescriptions Disp Refills   phentermine 37.5 MG capsule [Pharmacy Med Name: PHENTERMINE 37.5MG  CAPSULES] 30 capsule     Sig: TAKE 1 CAPSULE(37.5 MG) BY MOUTH EVERY MORNING     Not Delegated - Neurology: Anticonvulsants - Controlled - phentermine hydrochloride Failed - 02/19/2022  6:51 PM      Failed - This refill cannot be delegated      Passed - eGFR in normal range and within 360 days    GFR, Est African American  Date Value Ref Range Status  09/17/2018 80 > OR = 60 mL/min/1.60m2 Final   GFR, Est Non African American  Date Value Ref Range Status  09/17/2018 69 > OR = 60 mL/min/1.69m2 Final   eGFR  Date Value Ref Range Status  09/30/2021 75 > OR = 60 mL/min/1.44m2 Final         Passed - Cr in normal range and within 360 days    Creat  Date Value Ref Range Status  09/30/2021 0.93 0.50 - 1.03 mg/dL Final         Passed - Last BP in normal range    BP Readings from Last 1 Encounters:  11/22/21 134/81         Passed - Valid encounter within last 6 months    Recent Outpatient Visits           3 months ago Annual physical exam   [facility] Olin Hauser, DO   6 months ago Acute non-recurrent frontal sinusitis   [facility] [place], Devonne Doughty, DO   8 months ago Other [facility] Olin Hauser, DO   1 year ago Other eczema   [facility] Olin Hauser, DO   2 years ago Acute non-recurrent maxillary sinusitis   [place], DO       Future Appointments             In 3 months [place], Devonne Doughty, DO [facility], [place] - Weight completed in the last 3 months    Wt Readings from Last 1 Encounters:  11/22/21 214 lb 3.2 oz (97.2 kg)            Requested Prescriptions  Pending Prescriptions Disp Refills   phentermine 37.5 MG capsule [Pharmacy Med Name: PHENTERMINE 37.5MG  CAPSULES] 30 capsule     Sig: TAKE 1 CAPSULE(37.5 MG) BY MOUTH EVERY MORNING     Not Delegated - Neurology: Anticonvulsants - Controlled - phentermine hydrochloride Failed - 02/19/2022  6:51 PM      Failed - This refill cannot be delegated      Passed - eGFR in normal range and within 360 days    GFR, Est African American  Date Value Ref Range Status  09/17/2018 80 > OR = 60 mL/min/1.73m2 Final   GFR, Est Non African American  Date Value Ref Range Status  09/17/2018 69 > OR = 60 mL/min/1.67m2 Final   eGFR  Date Value Ref Range Status  09/30/2021 75 > OR = 60 mL/min/1.55m2 Final  Passed - Cr in normal range and within 360 days    Creat  Date Value Ref Range Status  09/30/2021 0.93 0.50 - 1.03 mg/dL Final         Passed - Last BP in normal range    BP Readings from Last 1 Encounters:  11/22/21 134/81         Passed - Valid encounter within last 6 months    Recent Outpatient Visits           3 months ago Annual physical exam   [facility] [place], Devonne Doughty, DO   6 months ago Acute non-recurrent frontal sinusitis   [facility] Olin Hauser, DO   8 months ago Other [facility] Olin Hauser, DO   1 year ago Other [facility] Olin Hauser, DO   2 years ago Acute non-recurrent maxillary sinusitis   [place], DO       Future Appointments             In 3 months [place], Devonne Doughty, [place], [place] - Weight completed in the last 3 months    Wt Readings from Last 1 Encounters:  11/22/21 214 lb 3.2 oz (97.2 kg)

## 2022-02-22 MED ORDER — PHENTERMINE HCL 37.5 MG PO CAPS: 37.5000 mg | ORAL_CAPSULE | ORAL | 0 refills | Status: DC

## 2022-03-28 ENCOUNTER — Other Ambulatory Visit: Payer: Self-pay | Admitting: Family Medicine

## 2022-03-28 DIAGNOSIS — E669 Obesity, unspecified: Secondary | ICD-10-CM

## 2022-03-28 MED ORDER — PHENTERMINE HCL 37.5 MG PO CAPS: 37.5000 mg | ORAL_CAPSULE | ORAL | 0 refills | Status: DC

## 2022-04-06 ENCOUNTER — Encounter: Payer: Self-pay | Admitting: Family Medicine

## 2022-04-06 ENCOUNTER — Ambulatory Visit: Payer: BC Managed Care – PPO | Admitting: Family Medicine

## 2022-04-06 VITALS — BP 124/76 | HR 84 | Ht 64.0 in | Wt 198.4 lb

## 2022-04-06 DIAGNOSIS — L02212 Cutaneous abscess of back [any part, except buttock]: Secondary | ICD-10-CM

## 2022-04-06 DIAGNOSIS — L02411 Cutaneous abscess of right axilla: Secondary | ICD-10-CM

## 2022-04-06 DIAGNOSIS — R110 Nausea: Secondary | ICD-10-CM

## 2022-04-06 MED ORDER — ONDANSETRON 4 MG PO TBDP: 4.0000 mg | ORAL_TABLET | Freq: Three times a day (TID) | ORAL | 0 refills | Status: DC | PRN

## 2022-04-06 MED ORDER — OXYCODONE-ACETAMINOPHEN 5-325 MG PO TABS: 1.0000 | ORAL_TABLET | Freq: Four times a day (QID) | ORAL | 0 refills | Status: DC | PRN

## 2022-04-06 MED ORDER — DOXYCYCLINE HYCLATE 100 MG PO TABS: 100.0000 mg | ORAL_TABLET | Freq: Two times a day (BID) | ORAL | 0 refills | Status: DC

## 2022-04-06 NOTE — Patient Instructions (Addendum)
Thank you for coming to the office today.  You have an abscess, which is a localized bacterial infection in deeper layers of skin.  Referral Urgently to [facility] [place], [place] [id number] Get Directions Appointments [phone]  Dr Warnell Forester 330pm for registration, apt at [facility] taking antibiotic as prescribed, Doxycycline 100mg  twice daily for 10 days, make sure to take with full glass of water and stay upright or seated for 30 min (do not lay down after taking or can cause burning irritation of throat).  Warm compresses as tolerated  Percocet for pain as needed  You may need to return soon for re-evaluation if worsening such as spreading redness or streaking redness, significantly larger size, increased pain, fevers/chills, nausea vomiting and cannot take antibiotic. If significantly worse symptoms or most of these symptoms, would recommend going straight to Hospital Emergency Dept as you may require IV antibiotics instead.   Please schedule a Follow-up Appointment to: Return if symptoms worsen or fail to improve.  If you have any other questions or concerns, please feel free to call the office or send a message through [place]. You may also schedule an earlier appointment if necessary.  Additionally, you may be receiving a survey about your experience at our office within a few days to 1 week by e-mail or mail. We value your feedback.  Nobie Putnam, DO [place]

## 2022-04-06 NOTE — Progress Notes (Signed)
Subjective:    Patient ID: Caroline Wang, female    DOB: 1970/06/23, 52 y.o.   MRN: [id number]  Caroline Wang is a 52 y.o. female presenting on 04/06/2022 for Abscess  Patient presents for a same day appointment.   HPI  R Axillary Abscess, large vs multiple R Posterior Shoulder Abscess  New onset problem recently within past few days, thought had spot on R back shoulder that was bug bite but it developed some swelling and pain. She tried to drain it not much came out. Then developed larger area of pain and swelling under arm axillary R side, had some drainage. Notable pain with swelling of this area. No extending redness. She used baking powder to keep it dry, could not use deodorant.  No prior history of boils or abscesses before. She said her children have had abscesses under arms but she had not had this problem and no prior family history on her side.  Denies fever chills nausea vomiting spreading redness other boils      07/28/2021    2:14 PM 06/09/2021    1:46 PM 03/19/2019    2:51 PM  Depression screen PHQ 2/9  Decreased Interest 1 0 0  Down, Depressed, Hopeless 0 0 0  PHQ - 2 Score 1 0 0  Altered sleeping 1 0   Tired, decreased energy 1 0   Change in appetite 0 0   Feeling bad or failure about yourself  0 0   Trouble concentrating 0 0   Moving slowly or fidgety/restless 0 0   Suicidal thoughts 0 0   PHQ-9 Score 3 0   Difficult doing work/chores Not difficult at all Not difficult at all     Social History   Tobacco Use   Smoking status: Never   Smokeless tobacco: Never  Vaping Use   Vaping Use: Never used  Substance Use Topics   Alcohol use: Yes    Alcohol/week: 0.0 standard drinks of alcohol   Drug use: No    Review of Systems Per HPI unless specifically indicated above     Objective:    BP 124/76   Pulse 84   Ht 5\' 4"  (1.626 m)   Wt 198 lb 6.4 oz (90 kg)   SpO2 98%   BMI 34.06 kg/m   Wt Readings from Last 3 Encounters:  04/06/22 198 lb 6.4  oz (90 kg)  11/22/21 214 lb 3.2 oz (97.2 kg)  07/28/21 204 lb 9.6 oz (92.8 kg)    Physical Exam Vitals and nursing note reviewed.  Constitutional:      General: She is not in acute distress.    Appearance: Normal appearance. She is well-developed. She is not diaphoretic.     Comments: Well-appearing, comfortable, cooperative  HENT:     Head: Normocephalic and atraumatic.  Eyes:     General:        Right eye: No discharge.        Left eye: No discharge.     Conjunctiva/sclera: Conjunctivae normal.  Cardiovascular:     Rate and Rhythm: Normal rate.  Pulmonary:     Effort: Pulmonary effort is normal.  Skin:    General: Skin is warm and dry.     Findings: Lesion (R axillary pictured, large area of induration seems multiple regions 8 cm across approx within axillary, some superficial purulent drainage minimal, no extension. tender to touch. Also R posterior shoulder 1-2 cm area of induration) present. No erythema or rash.  Neurological:     Mental Status: She is alert and oriented to person, place, and time.  Psychiatric:        Mood and Affect: Mood normal.        Behavior: Behavior normal.        Thought Content: Thought content normal.     Comments: Well groomed, good eye contact, normal speech and thoughts     R Axillary region    Right Shoulder posterior   Results for orders placed or performed in visit on 09/29/21  Hemoglobin A1c  Result Value Ref Range   Hgb A1c MFr Bld 5.7 (H) <5.7 % of total Hgb   Mean Plasma Glucose 117 mg/dL   eAG (mmol/L) 6.5 mmol/L  Lipid panel  Result Value Ref Range   Cholesterol 192 <200 mg/dL   HDL 81 > OR = 50 mg/dL   Triglycerides 70 <150 mg/dL   LDL Cholesterol (Calc) 95 mg/dL (calc)   Total CHOL/HDL Ratio 2.4 <5.0 (calc)   Non-HDL Cholesterol (Calc) 111 <130 mg/dL (calc)  CBC with Differential/Platelet  Result Value Ref Range   WBC 5.9 3.8 - 10.8 Thousand/uL   RBC 4.15 3.80 - 5.10 Million/uL   Hemoglobin 12.1 11.7 - 15.5 g/dL    HCT 36.2 35.0 - 45.0 %   MCV 87.2 80.0 - 100.0 fL   MCH 29.2 27.0 - 33.0 pg   MCHC 33.4 32.0 - 36.0 g/dL   RDW 12.7 11.0 - 15.0 %   Platelets 309 140 - 400 Thousand/uL   MPV 10.5 7.5 - 12.5 fL   Neutro Abs 3,664 1,500 - 7,800 cells/uL   Lymphs Abs 1,422 850 - 3,900 cells/uL   Absolute Monocytes 684 200 - 950 cells/uL   Eosinophils Absolute 112 15 - 500 cells/uL   Basophils Absolute 18 0 - 200 cells/uL   Neutrophils Relative % 62.1 %   Total Lymphocyte 24.1 %   Monocytes Relative 11.6 %   Eosinophils Relative 1.9 %   Basophils Relative 0.3 %  COMPLETE METABOLIC PANEL WITH GFR  Result Value Ref Range   Glucose, Bld 95 65 - 99 mg/dL   BUN 14 7 - 25 mg/dL   Creat 0.93 0.50 - 1.03 mg/dL   eGFR 75 > OR = 60 mL/min/1.57m2   BUN/Creatinine Ratio SEE NOTE: 6 - 22 (calc)   Sodium 138 135 - 146 mmol/L   Potassium 4.0 3.5 - 5.3 mmol/L   Chloride 102 98 - 110 mmol/L   CO2 28 20 - 32 mmol/L   Calcium 9.4 8.6 - 10.4 mg/dL   Total Protein 7.4 6.1 - 8.1 g/dL   Albumin 4.2 3.6 - 5.1 g/dL   Globulin 3.2 1.9 - 3.7 g/dL (calc)   AG Ratio 1.3 1.0 - 2.5 (calc)   Total Bilirubin 0.5 0.2 - 1.2 mg/dL   Alkaline phosphatase (APISO) 61 37 - 153 U/L   AST 20 10 - 35 U/L   ALT 20 6 - 29 U/L      Assessment & Plan:   Problem List Items Addressed This Visit   None Visit Diagnoses     Abscess of axilla, right    -  Primary   Relevant Medications   doxycycline (VIBRA-TABS) 100 MG tablet   oxyCODONE-acetaminophen (PERCOCET/ROXICET) 5-325 MG tablet   Other Relevant Orders   Ambulatory referral to General Surgery   Cutaneous abscess of back excluding buttocks       Relevant Medications   doxycycline (VIBRA-TABS) 100 MG tablet  oxyCODONE-acetaminophen (PERCOCET/ROXICET) 5-325 MG tablet   Other Relevant Orders   Ambulatory referral to General Surgery   Nausea       Relevant Medications   ondansetron (ZOFRAN-ODT) 4 MG disintegrating tablet       Consistent with new acute R axillary abscess  large area 7-8 cm across that is painful with firm induration without surrounding cellulitis or any systemic symptoms. No history of recurrent abcesses in this or other areas, not consistent with chronic suppurativa hidradenitis. No recent antibiotics.  Also localized R Posterior shoulder 1-2 cm abscess  Some spontaneous drainage. Area appears too large/complex for local I&D that I am comfortable with today  Start taking Doxycycline antibiotic 100mg  twice daily for 10 days. Take with full glass of water and stay upright for at least 30 min after taking, may be seated or standing, but should NOT lay down. This is just a safety precaution, if this medicine does not go all the way down throat well it could cause some burning discomfort to throat and esophagus.  Rx Percocet 5/325 AS NEEDED pain  Rx Zofran ODT AS NEEDED nausea only if needed  Warm compresses  Coordinated w/ Dr Windell Moment on secure chat through [place] and he is agreeable to see patient as new urgent referral to Gen Surgery tomorrow Thurs 3/21 at 4pm, can get her checked in at 330 for new patient paperwork  Referral submitted   Meds ordered this encounter  Medications   doxycycline (VIBRA-TABS) 100 MG tablet    Sig: Take 1 tablet (100 mg total) by mouth 2 (two) times daily. For 10 days. Take with full glass of water, stay upright 30 min after taking.    Dispense:  20 tablet    Refill:  0   oxyCODONE-acetaminophen (PERCOCET/ROXICET) 5-325 MG tablet    Sig: Take 1 tablet by mouth every 6 (six) hours as needed for severe pain.    Dispense:  20 tablet    Refill:  0   ondansetron (ZOFRAN-ODT) 4 MG disintegrating tablet    Sig: Take 1 tablet (4 mg total) by mouth every 8 (eight) hours as needed for nausea or vomiting.    Dispense:  20 tablet    Refill:  0     Follow up plan: Return if symptoms worsen or fail to improve.   Nobie Putnam, [facility] 04/06/2022, 4:36  PM

## 2022-04-08 ENCOUNTER — Other Ambulatory Visit: Payer: Self-pay

## 2022-04-08 ENCOUNTER — Ambulatory Visit: Payer: BC Managed Care – PPO | Admitting: Certified Registered"

## 2022-04-08 ENCOUNTER — Ambulatory Visit
Admission: RE | Admit: 2022-04-08 | Discharge: 2022-04-08 | Disposition: A | Discharge status: Home / Self Care | Payer: BC Managed Care – PPO | Attending: General Surgery | Admitting: General Surgery

## 2022-04-08 ENCOUNTER — Encounter: Payer: Self-pay | Admitting: General Surgery

## 2022-04-08 ENCOUNTER — Ambulatory Visit: Payer: Self-pay | Admitting: General Surgery

## 2022-04-08 ENCOUNTER — Encounter
Admission: RE | Disposition: A | Discharge status: Home / Self Care | Payer: Self-pay | Source: Home / Self Care | Attending: General Surgery

## 2022-04-08 DIAGNOSIS — L732 Hidradenitis suppurativa: Secondary | ICD-10-CM | POA: Insufficient documentation

## 2022-04-08 DIAGNOSIS — Z419 Encounter for procedure for purposes other than remedying health state, unspecified: Secondary | ICD-10-CM

## 2022-04-08 DIAGNOSIS — Z792 Long term (current) use of antibiotics: Secondary | ICD-10-CM | POA: Diagnosis not present

## 2022-04-08 DIAGNOSIS — L02411 Cutaneous abscess of right axilla: Secondary | ICD-10-CM | POA: Diagnosis present

## 2022-04-08 HISTORY — PX: INCISION AND DRAINAGE ABSCESS: SHX5864

## 2022-04-08 SURGERY — INCISION AND DRAINAGE, ABSCESS
Anesthesia: General | Site: Axilla | Laterality: Right

## 2022-04-08 MED ORDER — ACETAMINOPHEN 500 MG PO TABS
1000.0000 mg | ORAL_TABLET | Freq: Once | ORAL | Status: AC
  Administered 2022-04-08: 1000 mg via ORAL

## 2022-04-08 MED ORDER — CEFAZOLIN SODIUM-DEXTROSE 2-4 GM/100ML-% IV SOLN
INTRAVENOUS | Status: AC
  Filled 2022-04-08: qty 100

## 2022-04-08 MED ORDER — MIDAZOLAM HCL 2 MG/2ML IJ SOLN
INTRAMUSCULAR | Status: AC
  Filled 2022-04-08: qty 2

## 2022-04-08 MED ORDER — CHLORHEXIDINE GLUCONATE 0.12 % MT SOLN: 15.0000 mL | Freq: Once | OROMUCOSAL | Status: AC

## 2022-04-08 MED ORDER — FENTANYL CITRATE (PF) 100 MCG/2ML IJ SOLN
INTRAMUSCULAR | Status: AC
  Filled 2022-04-08: qty 2

## 2022-04-08 MED ORDER — ACETAMINOPHEN 500 MG PO TABS
ORAL_TABLET | ORAL | Status: AC
  Filled 2022-04-08: qty 2

## 2022-04-08 MED ORDER — LACTATED RINGERS IV SOLN: INTRAVENOUS | Status: DC

## 2022-04-08 MED ORDER — DEXMEDETOMIDINE HCL IN NACL 80 MCG/20ML IV SOLN
INTRAVENOUS | Status: DC | PRN
  Administered 2022-04-08 (×3): 4 ug via INTRAVENOUS

## 2022-04-08 MED ORDER — LIDOCAINE HCL (CARDIAC) PF 100 MG/5ML IV SOSY
PREFILLED_SYRINGE | INTRAVENOUS | Status: DC | PRN
  Administered 2022-04-08: 60 mg via INTRAVENOUS

## 2022-04-08 MED ORDER — OXYCODONE-ACETAMINOPHEN 5-325 MG PO TABS: 1.0000 | ORAL_TABLET | ORAL | 0 refills | Status: DC | PRN

## 2022-04-08 MED ORDER — 0.9 % SODIUM CHLORIDE (POUR BTL) OPTIME
TOPICAL | Status: DC | PRN
  Administered 2022-04-08: 500 mL

## 2022-04-08 MED ORDER — PROPOFOL 500 MG/50ML IV EMUL
INTRAVENOUS | Status: DC | PRN
  Administered 2022-04-08: 125 ug/kg/min via INTRAVENOUS

## 2022-04-08 MED ORDER — CEFAZOLIN SODIUM-DEXTROSE 2-4 GM/100ML-% IV SOLN
2.0000 g | INTRAVENOUS | Status: AC
  Administered 2022-04-08: 2 g via INTRAVENOUS

## 2022-04-08 MED ORDER — PROPOFOL 1000 MG/100ML IV EMUL
INTRAVENOUS | Status: AC
  Filled 2022-04-08: qty 100

## 2022-04-08 MED ORDER — CHLORHEXIDINE GLUCONATE 0.12 % MT SOLN
OROMUCOSAL | Status: AC
  Administered 2022-04-08: 15 mL via OROMUCOSAL
  Filled 2022-04-08: qty 15

## 2022-04-08 MED ORDER — ONDANSETRON HCL 4 MG/2ML IJ SOLN
INTRAMUSCULAR | Status: DC | PRN
  Administered 2022-04-08: 4 mg via INTRAVENOUS

## 2022-04-08 MED ORDER — FENTANYL CITRATE (PF) 100 MCG/2ML IJ SOLN
INTRAMUSCULAR | Status: DC | PRN
  Administered 2022-04-08 (×2): 25 ug via INTRAVENOUS
  Administered 2022-04-08: 50 ug via INTRAVENOUS

## 2022-04-08 MED ORDER — PROPOFOL 10 MG/ML IV BOLUS
INTRAVENOUS | Status: DC | PRN
  Administered 2022-04-08: 30 mg via INTRAVENOUS
  Administered 2022-04-08: 20 mg via INTRAVENOUS

## 2022-04-08 MED ORDER — HYDROGEN PEROXIDE 3 % EX SOLN
CUTANEOUS | Status: DC | PRN
  Administered 2022-04-08: 1

## 2022-04-08 MED ORDER — FENTANYL CITRATE (PF) 100 MCG/2ML IJ SOLN: 25.0000 ug | INTRAMUSCULAR | Status: DC | PRN

## 2022-04-08 MED ORDER — BUPIVACAINE LIPOSOME 1.3 % IJ SUSP
INTRAMUSCULAR | Status: DC | PRN
  Administered 2022-04-08: 20 mL

## 2022-04-08 MED ORDER — MIDAZOLAM HCL 2 MG/2ML IJ SOLN
INTRAMUSCULAR | Status: DC | PRN
  Administered 2022-04-08: 2 mg via INTRAVENOUS

## 2022-04-08 MED ORDER — ORAL CARE MOUTH RINSE: 15.0000 mL | Freq: Once | OROMUCOSAL | Status: AC

## 2022-04-08 SURGICAL SUPPLY — 24 items
BLADE CLIPPER SURG (BLADE) ×1 IMPLANT
BLADE SURG SZ11 CARB STEEL (BLADE) ×1 IMPLANT
CHLORAPREP W/TINT 26 (MISCELLANEOUS) ×1 IMPLANT
DRAPE LAPAROTOMY 77X122 PED (DRAPES) ×1 IMPLANT
ELECT REM PT RETURN 9FT ADLT (ELECTROSURGICAL) ×1
ELECTRODE REM PT RTRN 9FT ADLT (ELECTROSURGICAL) ×1 IMPLANT
GAUZE PACKING 0.25INX5YD STRL (GAUZE/BANDAGES/DRESSINGS) IMPLANT
GAUZE PACKING IODOFORM 1/2INX (GAUZE/BANDAGES/DRESSINGS) IMPLANT
GAUZE SPONGE 4X4 12PLY STRL (GAUZE/BANDAGES/DRESSINGS) ×1 IMPLANT
GLOVE BIO SURGEON STRL SZ 6.5 (GLOVE) ×1 IMPLANT
GLOVE BIOGEL PI IND STRL 6.5 (GLOVE) ×1 IMPLANT
GOWN STRL REUS W/ TWL LRG LVL3 (GOWN DISPOSABLE) ×2 IMPLANT
GOWN STRL REUS W/TWL LRG LVL3 (GOWN DISPOSABLE) ×2
MANIFOLD NEPTUNE II (INSTRUMENTS) ×1 IMPLANT
NDL HYPO 22X1.5 SAFETY MO (MISCELLANEOUS) ×1 IMPLANT
NEEDLE HYPO 22X1.5 SAFETY MO (MISCELLANEOUS) ×1 IMPLANT
NS IRRIG 1000ML POUR BTL (IV SOLUTION) ×1 IMPLANT
PACK BASIN MINOR ARMC (MISCELLANEOUS) ×1 IMPLANT
PAD ABD DERMACEA PRESS 5X9 (GAUZE/BANDAGES/DRESSINGS) IMPLANT
SOL PREP PVP 2OZ (MISCELLANEOUS) ×2
SOLUTION PREP PVP 2OZ (MISCELLANEOUS) ×2 IMPLANT
SPONGE T-LAP 18X18 ~~LOC~~+RFID (SPONGE) ×1 IMPLANT
TRAP FLUID SMOKE EVACUATOR (MISCELLANEOUS) ×1 IMPLANT
WATER STERILE IRR 500ML POUR (IV SOLUTION) ×1 IMPLANT

## 2022-04-08 NOTE — Anesthesia Preprocedure Evaluation (Signed)
Anesthesia Evaluation  Patient identified by MRN, date of birth, ID band Patient awake    Reviewed: Allergy & Precautions, NPO status , Patient's Chart, lab work & pertinent test results  History of Anesthesia Complications Negative for: history of anesthetic complications  Airway Mallampati: III  TM Distance: >3 FB Neck ROM: full    Dental  (+) Chipped   Pulmonary neg pulmonary ROS, neg shortness of breath   Pulmonary exam normal        Cardiovascular Exercise Tolerance: Good (-) angina (-) Past MI negative cardio ROS Normal cardiovascular exam     Neuro/Psych  Headaches, neg Seizures  negative psych ROS   GI/Hepatic negative GI ROS, Neg liver ROS,neg GERD  ,,  Endo/Other  negative endocrine ROS    Renal/GU negative Renal ROS  negative genitourinary   Musculoskeletal   Abdominal   Peds  Hematology negative hematology ROS (+)   Anesthesia Other Findings Past Medical History: No date: Headache  Past Surgical History: No date: CESAREAN SECTION No date: CHOLECYSTECTOMY No date: SHOULDER ARTHROSCOPY DISTAL CLAVICLE EXCISION AND OPEN  ROTATOR CUFF REPAIR 02/2004: TOTAL ABDOMINAL HYSTERECTOMY     Comment:  Total Abdominal including ovaries, cervix - [place]               [facility] Dr Annetta Maw  BMI    Body Mass Index: 34.40 kg/m      Reproductive/Obstetrics negative OB ROS                             Anesthesia Physical Anesthesia Plan  ASA: 2  Anesthesia Plan: General   Post-op Pain Management:    Induction: Intravenous  PONV Risk Score and Plan: Propofol infusion and TIVA  Airway Management Planned: Natural Airway and Simple Face Mask  Additional Equipment:   Intra-op Plan:   Post-operative Plan:   Informed Consent: I have reviewed the patients History and Physical, chart, labs and discussed the procedure including the risks, benefits and  alternatives for the proposed anesthesia with the patient or authorized representative who has indicated his/her understanding and acceptance.     Dental Advisory Given  Plan Discussed with: Anesthesiologist, CRNA and Surgeon  Anesthesia Plan Comments: (Patient consented for risks of anesthesia including but not limited to:  - adverse reactions to medications - risk of airway placement if required - damage to eyes, teeth, lips or other oral mucosa - nerve damage due to positioning  - sore throat or hoarseness - Damage to heart, brain, nerves, lungs, other parts of body or loss of life  Patient voiced understanding.)        Anesthesia Quick Evaluation

## 2022-04-08 NOTE — Anesthesia Procedure Notes (Signed)
Date/Time: 04/08/2022 1:29 PM  Performed by: Lily Peer, Deion Forgue, CRNAPre-anesthesia Checklist: Patient identified, Emergency Drugs available, Timeout performed, Suction available and Patient being monitored Patient Re-evaluated:Patient Re-evaluated prior to induction Oxygen Delivery Method: Simple face mask Induction Type: IV induction

## 2022-04-08 NOTE — H&P (View-Only) (Signed)
PATIENT PROFILE: Caroline Wang is a 52 y.o. female who presents to the Clinic for consultation at the request of Dr. Parks Ranger for evaluation of right axillary abscess.   PCP:  Olin Hauser, DO   HISTORY OF PRESENT ILLNESS: Caroline Wang reports started having pain in the right axillary area for the last few days.  Pain is getting worse.  She is also complaining about significant swelling on the area.  It also started to drain few days ago.  Pain is severe at this moment.  She went to see the primary care yesterday and she was started on doxycycline.  Patient states some improvement with the medication.  She has just taken to doses of doxycycline.     PROBLEM LIST: Hidradenitis suppurativa with infection   GENERAL REVIEW OF SYSTEMS:    General ROS: negative for - chills, fatigue, fever, weight gain or weight loss Allergy and Immunology ROS: negative for - hives  Hematological and Lymphatic ROS: negative for - bleeding problems or bruising, negative for palpable nodes Endocrine ROS: negative for - heat or cold intolerance, hair changes Respiratory ROS: negative for - cough, shortness of breath or wheezing Cardiovascular ROS: no chest pain or palpitations GI ROS: negative for nausea, vomiting, abdominal pain, diarrhea, constipation Musculoskeletal ROS: negative for - joint swelling or muscle pain Neurological ROS: negative for - confusion, syncope Dermatological ROS: negative for pruritus and rash Psychiatric: negative for anxiety, depression, difficulty sleeping and memory loss   MEDICATIONS: Current Medications        Current Outpatient Medications  Medication Sig Dispense Refill   doxycycline (VIBRA-TABS) 100 MG tablet Take 100 mg by mouth once daily       fexofenadine (ALLEGRA) 180 MG tablet Take 180 mg by mouth once daily       hydrOXYzine (ATARAX) 25 MG tablet Take 25 mg by mouth 2 (two) times daily as needed       ipratropium (ATROVENT) 0.06 % nasal spray Place 2  sprays into both nostrils as needed       montelukast (SINGULAIR) 10 mg tablet Take 10 mg by mouth at bedtime       ondansetron (ZOFRAN-ODT) 4 MG disintegrating tablet Take 4 mg by mouth every 8 (eight) hours as needed       oxyCODONE-acetaminophen (PERCOCET) 5-325 mg tablet Take 1 tablet by mouth every 6 (six) hours as needed       phentermine (ADIPEX-P) 37.5 MG capsule Take 37.5 mg by mouth every morning before breakfast        No current facility-administered medications for this visit.        ALLERGIES: Patient has no known allergies.   PAST MEDICAL HISTORY: History reviewed. No pertinent past medical history.   PAST SURGICAL HISTORY: History reviewed. No pertinent surgical history.    FAMILY HISTORY: History reviewed. No pertinent family history.    SOCIAL HISTORY: Social History          Socioeconomic History   Marital status: Single        PHYSICAL EXAM:    Vitals:    04/07/22 1544  BP: 123/79  Pulse: 79    Body mass index is 19.37 kg/m. Weight: 54.4 kg (120 lb)    GENERAL: Alert, active, oriented x3   HEENT: Pupils equal reactive to light. Extraocular movements are intact. Sclera clear. Palpebral conjunctiva normal red color.Pharynx clear.   NECK: Supple with no palpable mass and no adenopathy.   LUNGS: Sound clear with no rales rhonchi or  wheezes.   HEART: Regular rhythm S1 and S2 without murmur.   ABDOMEN: Soft and depressible, nontender with no palpable mass, no hepatomegaly. Wounds dry and clean.   EXTREMITIES: Well-developed well-nourished symmetrical with no dependent edema.  Multiple, loculated right axillary abscesses.  Consistent with hidradenitis.  Swelling and erythema.   NEUROLOGICAL: Awake alert oriented, facial expression symmetrical, moving all extremities.   REVIEW OF DATA: I have reviewed the following data today: No results found for any previous visit.      ASSESSMENT: Caroline Wang is a 52 y.o. female presenting for  consultation for complex abscess of the right axillary area.   Physical exam is suggestive of hidradenitis suppurativa with infection.  Discussed with patient alternative of trying to continue antibiotic therapy that started yesterday with warm compresses versus taking her to the operating room for incision and drainage.  Discussed with patient that incision and drainage means that she will have multiple open wounds that she will need to fact daily.  Patient would like to avoid complex wound care.  She will prefer to try to see if the antibiotic therapy because spontaneous drainage.  She is also using warm compresses.  I discussed with patient to contact us at any time if she change her mind and would like to proceed with I&D.  Patient called today with worsening pain an would like to proceed with incision and drainage as discussed yesterday.    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  Incision and drainage of right axillary abscess   Patient verbalized understanding, all questions were answered, and were agreeable with the plan outlined above.

## 2022-04-08 NOTE — H&P (Signed)
PATIENT PROFILE: Caroline Wang is a 52 y.o. female who presents to the Clinic for consultation at the request of Dr. Parks Ranger for evaluation of right axillary abscess.   PCP:  Olin Hauser, DO   HISTORY OF PRESENT ILLNESS: Caroline Wang reports started having pain in the right axillary area for the last few days.  Pain is getting worse.  She is also complaining about significant swelling on the area.  It also started to drain few days ago.  Pain is severe at this moment.  She went to see the primary care yesterday and she was started on doxycycline.  Patient states some improvement with the medication.  She has just taken to doses of doxycycline.     PROBLEM LIST: Hidradenitis suppurativa with infection   GENERAL REVIEW OF SYSTEMS:    General ROS: negative for - chills, fatigue, fever, weight gain or weight loss Allergy and Immunology ROS: negative for - hives  Hematological and Lymphatic ROS: negative for - bleeding problems or bruising, negative for palpable nodes Endocrine ROS: negative for - heat or cold intolerance, hair changes Respiratory ROS: negative for - cough, shortness of breath or wheezing Cardiovascular ROS: no chest pain or palpitations GI ROS: negative for nausea, vomiting, abdominal pain, diarrhea, constipation Musculoskeletal ROS: negative for - joint swelling or muscle pain Neurological ROS: negative for - confusion, syncope Dermatological ROS: negative for pruritus and rash Psychiatric: negative for anxiety, depression, difficulty sleeping and memory loss   MEDICATIONS: Current Medications        Current Outpatient Medications  Medication Sig Dispense Refill   doxycycline (VIBRA-TABS) 100 MG tablet Take 100 mg by mouth once daily       fexofenadine (ALLEGRA) 180 MG tablet Take 180 mg by mouth once daily       hydrOXYzine (ATARAX) 25 MG tablet Take 25 mg by mouth 2 (two) times daily as needed       ipratropium (ATROVENT) 0.06 % nasal spray Place 2  sprays into both nostrils as needed       montelukast (SINGULAIR) 10 mg tablet Take 10 mg by mouth at bedtime       ondansetron (ZOFRAN-ODT) 4 MG disintegrating tablet Take 4 mg by mouth every 8 (eight) hours as needed       oxyCODONE-acetaminophen (PERCOCET) 5-325 mg tablet Take 1 tablet by mouth every 6 (six) hours as needed       phentermine (ADIPEX-P) 37.5 MG capsule Take 37.5 mg by mouth every morning before breakfast        No current facility-administered medications for this visit.        ALLERGIES: Patient has no known allergies.   PAST MEDICAL HISTORY: History reviewed. No pertinent past medical history.   PAST SURGICAL HISTORY: History reviewed. No pertinent surgical history.    FAMILY HISTORY: History reviewed. No pertinent family history.    SOCIAL HISTORY: Social History          Socioeconomic History   Marital status: Single        PHYSICAL EXAM:    Vitals:    04/07/22 1544  BP: 123/79  Pulse: 79    Body mass index is 19.37 kg/m. Weight: 54.4 kg (120 lb)    GENERAL: Alert, active, oriented x3   HEENT: Pupils equal reactive to light. Extraocular movements are intact. Sclera clear. Palpebral conjunctiva normal red color.Pharynx clear.   NECK: Supple with no palpable mass and no adenopathy.   LUNGS: Sound clear with no rales rhonchi or  wheezes.   HEART: Regular rhythm S1 and S2 without murmur.   ABDOMEN: Soft and depressible, nontender with no palpable mass, no hepatomegaly. Wounds dry and clean.   EXTREMITIES: Well-developed well-nourished symmetrical with no dependent edema.  Multiple, loculated right axillary abscesses.  Consistent with hidradenitis.  Swelling and erythema.   NEUROLOGICAL: Awake alert oriented, facial expression symmetrical, moving all extremities.   REVIEW OF DATA: I have reviewed the following data today: No results found for any previous visit.      ASSESSMENT: Caroline Wang is a 52 y.o. female presenting for  consultation for complex abscess of the right axillary area.   Physical exam is suggestive of hidradenitis suppurativa with infection.  Discussed with patient alternative of trying to continue antibiotic therapy that started yesterday with warm compresses versus taking her to the operating room for incision and drainage.  Discussed with patient that incision and drainage means that she will have multiple open wounds that she will need to fact daily.  Patient would like to avoid complex wound care.  She will prefer to try to see if the antibiotic therapy because spontaneous drainage.  She is also using warm compresses.  I discussed with patient to contact us at any time if she change her mind and would like to proceed with I&D.  Patient called today with worsening pain an would like to proceed with incision and drainage as discussed yesterday.    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  Incision and drainage of right axillary abscess   Patient verbalized understanding, all questions were answered, and were agreeable with the plan outlined above.

## 2022-04-08 NOTE — Discharge Instructions (Addendum)
  Diet: Resume home heart healthy regular diet.   Activity: Increase activity as tolerated. Light activity and walking are encouraged. Do not drive or drink alcohol if taking narcotic pain medications.  Wound care: Remove dressing and packing tomorrow. Once dressing removed, may shower with soapy water and pat dry (do not rub incisions), but no baths or submerging incision underwater until follow-up. (no swimming)   Medications: Resume all home medications. For mild to moderate pain: acetaminophen (Tylenol) or ibuprofen (if no kidney disease). Combining Tylenol with alcohol can substantially increase your risk of causing liver disease. Narcotic pain medications, if prescribed, can be used for severe pain, though may cause nausea, constipation, and drowsiness.  If you do not need the narcotic pain medication, you do not need to fill the prescription.  Call office [phone]) at any time if any questions, worsening pain, fevers/chills, bleeding, drainage from incision site, or other concerns.      AMBULATORY SURGERY  DISCHARGE INSTRUCTIONS   The drugs that you were given will stay in your system until tomorrow so for the next 24 hours you should not:  Drive an automobile Make any legal decisions Drink any alcoholic beverage   You may resume regular meals tomorrow.  Today it is better to start with liquids and gradually work up to solid foods.  You may eat anything you prefer, but it is better to start with liquids, then soup and crackers, and gradually work up to solid foods.   Please notify your doctor immediately if you have any unusual bleeding, trouble breathing, redness and pain at the surgery site, drainage, fever, or pain not relieved by medication.     Your post-operative visit with Dr.                                       is: Date:                        Time:    Please call to schedule your post-operative visit.  Additional Instructions:

## 2022-04-08 NOTE — Interval H&P Note (Signed)
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  The various methods of treatment have been discussed with the patient and family. After consideration of risks, benefits and other options for treatment, the patient has consented to  Procedure(s): INCISION AND DRAINAGE ABSCESS (Right) as a surgical intervention.  The patient's history has been reviewed, patient examined, no change in status, stable for surgery.  I have reviewed the patient's chart and labs.  Questions were answered to the patient's satisfaction.     Herbert Pun

## 2022-04-08 NOTE — Transfer of Care (Signed)
Immediate Anesthesia Transfer of Care Note  Patient: Caroline Wang  Procedure(s) Performed: INCISION AND DRAINAGE ABSCESS (Right: Axilla)  Patient Location: PACU  Anesthesia Type:General  Level of Consciousness: drowsy  Airway & Oxygen Therapy: Patient Spontanous Breathing  Post-op Assessment: Report given to RN  Post vital signs: Reviewed and stable  Last Vitals:  Vitals Value Taken Time  BP 137/89   Temp    Pulse 76   Resp 10 04/08/22 1407  SpO2 97   Vitals shown include unvalidated device data.  Last Pain:  Vitals:   04/08/22 1116  TempSrc: Temporal  PainSc: 0-No pain         Complications: No notable events documented.

## 2022-04-08 NOTE — Op Note (Signed)
Preoperative diagnosis: Right axillary abscess   Postoperative diagnosis: Right axillary abscess  Procedure: Incision and drainage of multiple right axillary abscess.  Anesthesia: Local and sedation  Surgeon: Dr. Windell Moment, MD  Wound Classification: Contaminated  Indications: Patient is a 52 y.o. female  presented with multiple abscesses on the right axilla consistent with infected hidradenitis.   Findings:  1. Abscess on the right axilla 2. Abundant purulent secretions drained and cultured 3. Adequate hemostasis.   Description of procedure: The patient was placed in the supine position and sedation was induced. The right axillary area was prepped and draped in the usual sterile fashion. A timeout was completed verifying correct patient, procedure, site, positioning, and implant(s) and/or special equipment prior to beginning this procedure.  A skin incision was made in multiple (5) abscess in the right axilla. The wounds were opened and purulent secretions was drained. With a hemostat blunt dissection of septas was done to be able to drain the abscesses completely. The cavity was irrigated with peroxide mixtures. The cavity flushed with saline until clear saline was aspirated. The wound was packed with a packing and wound left opened. Sterile dressing left in place. Local anesthesia infiltrated away of the infected area to block the area.  The patient tolerated the procedure well and was taken to the postanesthesia care unit in satisfactory condition.   Specimen: Right axillary abscess culture  Complications: None  Estimated Blood Loss: 5 mL

## 2022-04-11 ENCOUNTER — Encounter: Payer: Self-pay | Admitting: General Surgery

## 2022-04-12 NOTE — Anesthesia Postprocedure Evaluation (Signed)
Anesthesia Post Note  Patient: Caroline Wang  Procedure(s) Performed: INCISION AND DRAINAGE ABSCESS (Right: Axilla)  Patient location during evaluation: PACU Anesthesia Type: General Level of consciousness: awake and alert Pain management: pain level controlled Vital Signs Assessment: post-procedure vital signs reviewed and stable Respiratory status: spontaneous breathing, nonlabored ventilation, respiratory function stable and patient connected to nasal cannula oxygen Cardiovascular status: blood pressure returned to baseline and stable Postop Assessment: no apparent nausea or vomiting Anesthetic complications: no   No notable events documented.   Last Vitals:  Vitals:   04/08/22 1445 04/08/22 1501  BP: (!) 154/99 (!) 162/88  Pulse: 60 (!) 50  Resp: 16 18  Temp: (!) 36.2 C (!) 36.4 C  SpO2: 98% 100%    Last Pain:  Vitals:   04/08/22 1501  TempSrc: Temporal  PainSc: 0-No pain                 Martha Clan

## 2022-04-13 ENCOUNTER — Encounter: Payer: Self-pay | Admitting: Family Medicine

## 2022-04-13 LAB — AEROBIC/ANAEROBIC CULTURE W GRAM STAIN (SURGICAL/DEEP WOUND): Gram Stain: NONE SEEN

## 2022-04-13 NOTE — Telephone Encounter (Signed)
Route to PCP, sent to me in error.

## 2022-05-24 ENCOUNTER — Other Ambulatory Visit: Payer: Self-pay | Admitting: Family Medicine

## 2022-05-24 ENCOUNTER — Ambulatory Visit: Payer: BC Managed Care – PPO | Admitting: Family Medicine

## 2022-05-24 ENCOUNTER — Encounter: Payer: Self-pay | Admitting: Family Medicine

## 2022-05-24 VITALS — BP 148/82 | HR 63 | Ht 64.0 in | Wt 200.0 lb

## 2022-05-24 DIAGNOSIS — J309 Allergic rhinitis, unspecified: Secondary | ICD-10-CM

## 2022-05-24 DIAGNOSIS — L732 Hidradenitis suppurativa: Secondary | ICD-10-CM

## 2022-05-24 DIAGNOSIS — Z0000 Encounter for general adult medical examination without abnormal findings: Secondary | ICD-10-CM

## 2022-05-24 DIAGNOSIS — R7303 Prediabetes: Secondary | ICD-10-CM

## 2022-05-24 DIAGNOSIS — Z13220 Encounter for screening for lipoid disorders: Secondary | ICD-10-CM

## 2022-05-24 DIAGNOSIS — E669 Obesity, unspecified: Secondary | ICD-10-CM

## 2022-05-24 LAB — POCT GLYCOSYLATED HEMOGLOBIN (HGB A1C): Hemoglobin A1C: 5.5 % (ref 4.0–5.6)

## 2022-05-24 MED ORDER — MONTELUKAST SODIUM 10 MG PO TABS: 10.0000 mg | ORAL_TABLET | Freq: Every day | ORAL | 3 refills | Status: DC

## 2022-05-24 NOTE — Progress Notes (Signed)
Subjective:    Patient ID: Caroline Wang, female    DOB: Apr 30, 1970, 52 y.o.   MRN: [id number]  Caroline Wang is a 52 y.o. female presenting on 05/24/2022 for Medical Management of Chronic Issues   HPI  Axillary Hidradenitis  Dr Ebony Cargo [facility] Derm[facility])  Dr Hazle Quant [facility] Gen Surgery) She has had recurrent flares with abscess axillary that required I&D She is on Keflex 500mg  twice a day for 1 month Topical Clindamycin ointment twice a day for axillary  Obesity BMI >34  Elevated BP without HYPERTENSION, thought due to Phentermine She was on Phentermine previously, with some good results, weight loss. Curbed appetite. Last dose 1-2 weeks ago. Has BP cuff but not checking Failed Contrave due to side effects intolerance GLP injections not available per insurance Previously On Phentermine Feb 2024 - April 2024, short term 3 months She has continued gym exercise, drinking plenty of water, diet is appropriate. Weight initial 215 lbs down to 190s lbs, then back to 198-200 lbs currently  Elevated A1c Prior A1c 5.7 to 6.0 Due today for A1c She does not have significant fam history of DM Meds: never on med before Lifestyle: - Diet (Some drinking sodas again, and not always adhering but trying to improve diet) - Exercise ( Denies hypoglycemia  Saxenda possibly covered but unavailable.  Seasonal Allergies Environmental  Re order Montelukast 10mg       05/24/2022    8:11 AM 07/28/2021    2:14 PM 06/09/2021    1:46 PM  Depression screen PHQ 2/9  Decreased Interest 0 1 0  Down, Depressed, Hopeless 0 0 0  PHQ - 2 Score 0 1 0  Altered sleeping  1 0  Tired, decreased energy  1 0  Change in appetite  0 0  Feeling bad or failure about yourself   0 0  Trouble concentrating  0 0  Moving slowly or fidgety/restless  0 0  Suicidal thoughts  0 0  PHQ-9 Score  3 0  Difficult doing work/chores  Not difficult at all Not difficult at all    Social History    Tobacco Use   Smoking status: Never   Smokeless tobacco: Never  Vaping Use   Vaping Use: Never used  Substance Use Topics   Alcohol use: Yes    Alcohol/week: 0.0 standard drinks of alcohol   Drug use: No    Review of Systems Per HPI unless specifically indicated above     Objective:    BP (!) 148/82 (BP Location: Left Arm, Patient Position: Sitting, Cuff Size: Normal)   Pulse 63   Ht 5\' 4"  (1.626 m)   Wt 200 lb (90.7 kg)   SpO2 98%   BMI 34.33 kg/m   Wt Readings from Last 3 Encounters:  05/24/22 200 lb (90.7 kg)  04/08/22 198 lb (89.8 kg)  04/06/22 198 lb 6.4 oz (90 kg)    Physical Exam Vitals and nursing note reviewed.  Constitutional:      General: She is not in acute distress.    Appearance: Normal appearance. She is well-developed. She is obese. She is not diaphoretic.     Comments: Well-appearing, comfortable, cooperative  HENT:     Head: Normocephalic and atraumatic.  Eyes:     General:        Right eye: No discharge.        Left eye: No discharge.     Conjunctiva/sclera: Conjunctivae normal.  Cardiovascular:     Rate and Rhythm:  Normal rate.  Pulmonary:     Effort: Pulmonary effort is normal.  Skin:    General: Skin is warm and dry.     Findings: No erythema or rash.  Neurological:     Mental Status: She is alert and oriented to person, place, and time.  Psychiatric:        Mood and Affect: Mood normal.        Behavior: Behavior normal.        Thought Content: Thought content normal.     Comments: Well groomed, good eye contact, normal speech and thoughts      Results for orders placed or performed in visit on 05/24/22  POCT HgB A1C  Result Value Ref Range   Hemoglobin A1C 5.5 4.0 - 5.6 %   HbA1c POC (<> result, manual entry)     HbA1c, POC (prediabetic range)     HbA1c, POC (controlled diabetic range)        Assessment & Plan:   Problem List Items Addressed This Visit     Chronic allergic rhinitis   Relevant Medications    montelukast (SINGULAIR) 10 MG tablet   Obesity (BMI 30.0-34.9)   Other Visit Diagnoses     Pre-diabetes    -  Primary   Relevant Orders   POCT HgB A1C (Completed)   Hidradenitis suppurativa       Relevant Medications   cephALEXin (KEFLEX) 500 MG capsule      Allergies Montelukast 10mg  daily renewal  Elevated BP without HYPERTENSION Recently on Phentermine, suspect this was raising her BP. She is OFF Phentermine now. Also could be higher due to axillary hidradenitis infection Goal for lifestyle modification and monitor BP at home Reconsider med for BP in future.  Hidadenitis Followed by Gen Surg / Dermatology Has had I&D courses, and oral antibiotics Keflex course + topical clindamycin No active flare, but has had some more increased recently.  Obesity BMI  >34 Pre Diabetes  Previous discussion on weight management Failed Contrave Phentermine trial 3 months Feb 2024- April 2024. She had side effect high blood pressure Unavailable Saxenda  Discussed other options to access GLP1 therapy. [facility] - pay per visit. [facility] - compounding pharmacy for [employer] injections, we can explore this and I can help order if you are interested Future consider Saxenda if available or other medications.   Meds ordered this encounter  Medications   montelukast (SINGULAIR) 10 MG tablet    Sig: Take 1 tablet (10 mg total) by mouth at bedtime.    Dispense:  90 tablet    Refill:  3    Add refills for future      Follow up plan: Return in about 6 months (around 11/24/2022) for 6 month fasting lab only then 1 week later Annual Physical.  Future labs ordered for 11/2022   Saralyn Pilar, DO [facility] 05/24/2022, 8:18 AM

## 2022-05-24 NOTE — Patient Instructions (Addendum)
Thank you for coming to the office today.  [facility] - pay per visit.  [facility] - compounding pharmacy for [employer] injections, we can explore this and I can help order if you are interested  Future consider Saxenda if available or other medications.  BP slightly improved but still higher than average  Goal < 140 / 90, then stretch goal < 135 / 85  Keep improving  Recent Labs    09/30/21 0824 05/24/22 0831  HGBA1C 5.7* 5.5   Good work!   DUE for FASTING BLOOD WORK (no food or drink after midnight before the lab appointment, only water or coffee without cream/sugar on the morning of)  SCHEDULE "Lab Only" visit in the morning at the clinic for lab draw in 6 MONTHS   - Make sure Lab Only appointment is at about 1 week before your next appointment, so that results will be available  For Lab Results, once available within 2-3 days of blood draw, you can can log in to MyChart online to view your results and a brief explanation. Also, we can discuss results at next follow-up visit.   Please schedule a Follow-up Appointment to: Return in about 6 months (around 11/24/2022) for 6 month fasting lab only then 1 week later Annual Physical.  If you have any other questions or concerns, please feel free to call the office or send a message through MyChart. You may also schedule an earlier appointment if necessary.  Additionally, you may be receiving a survey about your experience at our office within a few days to 1 week by e-mail or mail. We value your feedback.  Saralyn Pilar, DO [facility], [place]

## 2022-07-13 ENCOUNTER — Other Ambulatory Visit: Payer: Self-pay | Admitting: Family Medicine

## 2022-07-13 DIAGNOSIS — J3089 Other allergic rhinitis: Secondary | ICD-10-CM

## 2022-07-14 ENCOUNTER — Other Ambulatory Visit: Payer: Self-pay | Admitting: Family Medicine

## 2022-07-14 DIAGNOSIS — R7303 Prediabetes: Secondary | ICD-10-CM

## 2022-07-14 DIAGNOSIS — E669 Obesity, unspecified: Secondary | ICD-10-CM

## 2022-07-14 NOTE — Telephone Encounter (Signed)
Requested Prescriptions  Pending Prescriptions Disp Refills   ALLERGY RELIEF 180 MG tablet [Pharmacy Med Name: FEXOFENADINE 180MG  TABLETS (OTC)] 90 tablet 1    Sig: TAKE 1 TABLET(180 MG) BY MOUTH DAILY     Ear, Nose, and Throat:  Antihistamines Passed - 07/13/2022 11:33 AM      Passed - Valid encounter within last 12 months    Recent Outpatient Visits           1 month ago Pre-diabetes   [place] [facility] [place], Netta Neat, DO   3 months ago Abscess of axilla, right   [place] [facility] [place], Netta Neat, DO   7 months ago Annual physical exam   [place] [facility] Smitty Cords, DO   11 months ago Acute non-recurrent frontal sinusitis   [place] [facility] Smitty Cords, DO   1 year ago Other eczema   [place] [facility] [place], Netta Neat, DO       Future Appointments             In 4 months Caroline Wang, Netta Neat, DO  [facility], [facility]

## 2022-07-15 NOTE — Telephone Encounter (Signed)
D/C 12/21/21. Requested Prescriptions  Refused Prescriptions Disp Refills   OZEMPIC, 0.25 OR 0.5 MG/DOSE, 2 MG/1.5ML SOPN [Pharmacy Med Name: OZEMPIC 0.25 OR 0.5MG /DOSE PN 1.5ML] 1.5 mL 3    Sig: INJECT 0.25 MG INTO THE SKIN ONCE A WEEK. FOR FIRST 4 WEEKS. THEN INCREASE DOSE TO 0.5 MG WEEKLY     Endocrinology:  Diabetes - GLP-1 Receptor Agonists - semaglutide Passed - 07/14/2022 11:40 AM      Passed - HBA1C in normal range and within 180 days    Hemoglobin A1C  Date Value Ref Range Status  05/24/2022 5.5 4.0 - 5.6 % Final   Hgb A1c MFr Bld  Date Value Ref Range Status  09/30/2021 5.7 (H) <5.7 % of total Hgb Final    Comment:    For someone without known diabetes, a hemoglobin  A1c value between 5.7% and 6.4% is consistent with prediabetes and should be confirmed with a  follow-up test. . For someone with known diabetes, a value <7% indicates that their diabetes is well controlled. A1c targets should be individualized based on duration of diabetes, age, comorbid conditions, and other considerations. . This assay result is consistent with an increased risk of diabetes. . Currently, no consensus exists regarding use of hemoglobin A1c for diagnosis of diabetes for children. .          Passed - Cr in normal range and within 360 days    Creat  Date Value Ref Range Status  09/30/2021 0.93 0.50 - 1.03 mg/dL Final         Passed - Valid encounter within last 6 months    Recent Outpatient Visits           1 month ago Pre-diabetes   [facility] [place], Netta Neat, DO   3 months ago Abscess of axilla, right   [place] [facility] [place], Netta Neat, DO   7 months ago Annual physical exam   [place] [facility] Smitty Cords, DO   11 months ago Acute non-recurrent frontal sinusitis   [facility] Smitty Cords, DO   1 year ago Other eczema   [facility] Smitty Cords, DO       Future Appointments             In 4 months Althea Charon, Netta Neat, DO [facility], [facility]

## 2022-08-04 ENCOUNTER — Other Ambulatory Visit: Payer: Self-pay | Admitting: Family Medicine

## 2022-08-04 DIAGNOSIS — Z1231 Encounter for screening mammogram for malignant neoplasm of breast: Secondary | ICD-10-CM

## 2022-08-09 ENCOUNTER — Other Ambulatory Visit: Payer: Self-pay | Admitting: Family Medicine

## 2022-08-09 DIAGNOSIS — Z1231 Encounter for screening mammogram for malignant neoplasm of breast: Secondary | ICD-10-CM

## 2022-08-25 ENCOUNTER — Ambulatory Visit
Admission: RE | Admit: 2022-08-25 | Discharge: 2022-08-25 | Disposition: A | Discharge status: Home / Self Care | Payer: BC Managed Care – PPO | Source: Ambulatory Visit | Attending: Family Medicine | Admitting: Family Medicine

## 2022-08-25 DIAGNOSIS — Z1231 Encounter for screening mammogram for malignant neoplasm of breast: Secondary | ICD-10-CM | POA: Insufficient documentation

## 2022-10-07 IMAGING — MG MM DIGITAL SCREENING BILAT W/ TOMO AND CAD
6 of 10 series · 6 of 30 positions shown · non-contrast
Comparison: Previous exam(s).

CLINICAL DATA: Screening.

EXAM:
DIGITAL SCREENING BILATERAL MAMMOGRAM WITH TOMOSYNTHESIS AND CAD
TECHNIQUE: Bilateral screening digital craniocaudal and mediolateral oblique
mammograms were obtained. Bilateral screening digital breast
tomosynthesis was performed. The images were evaluated with
computer-aided detection.

[R MLO synth-2D]
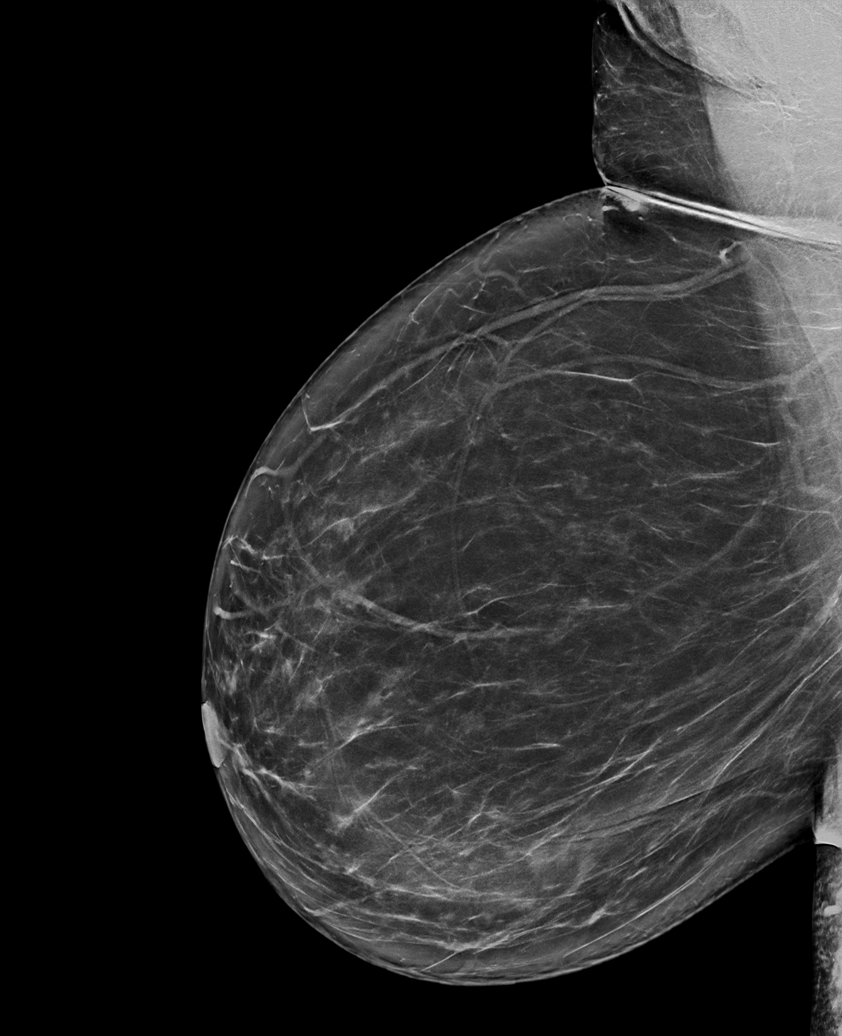

[L MLO synth-2D (1 of 2)]
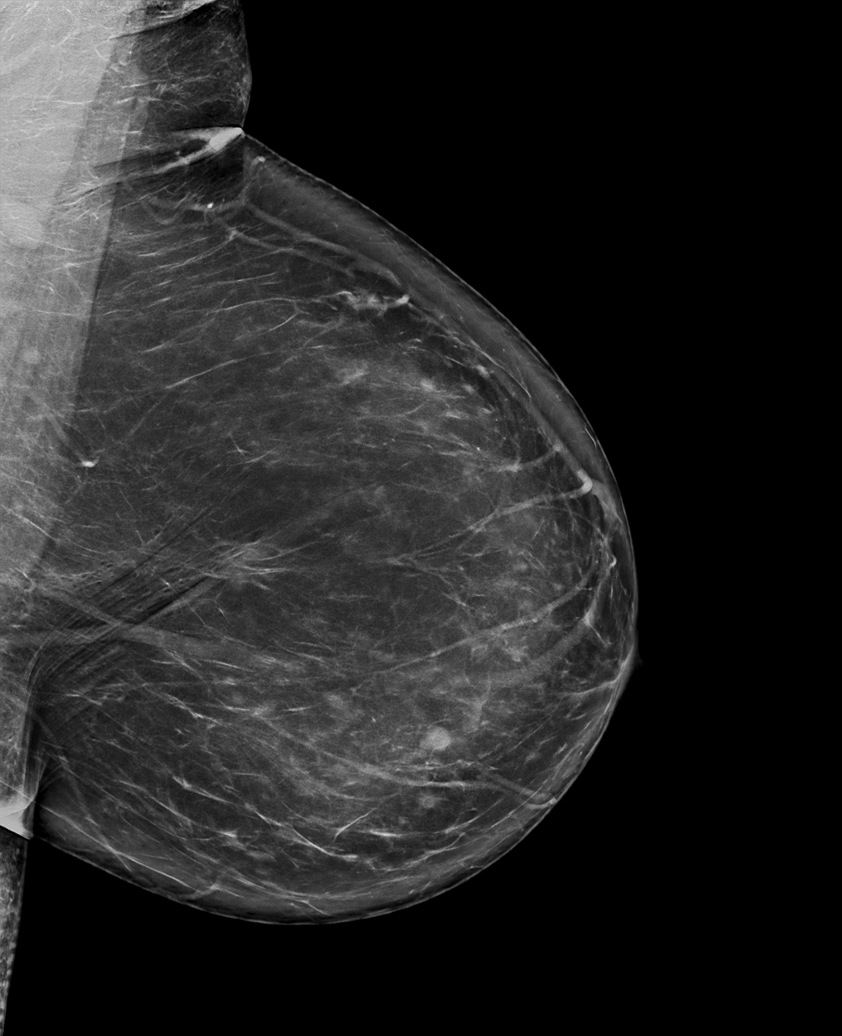

[R CC synth-2D]
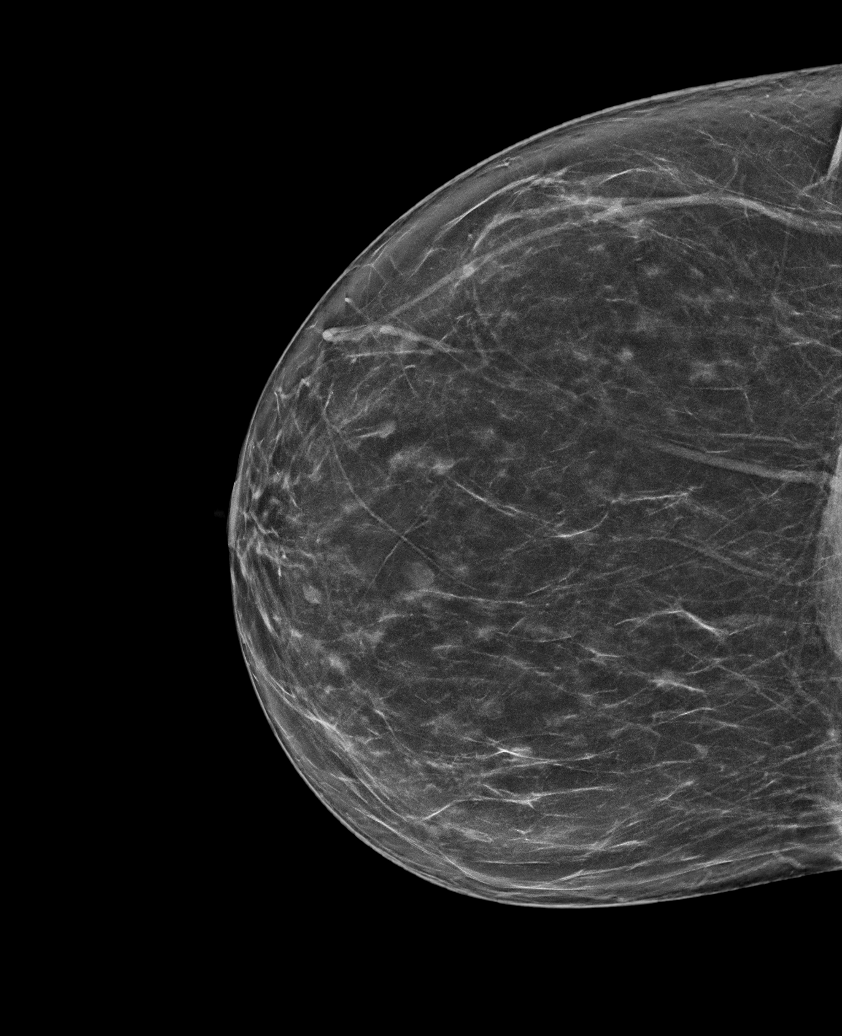

[L MLO synth-2D (2 of 2)]
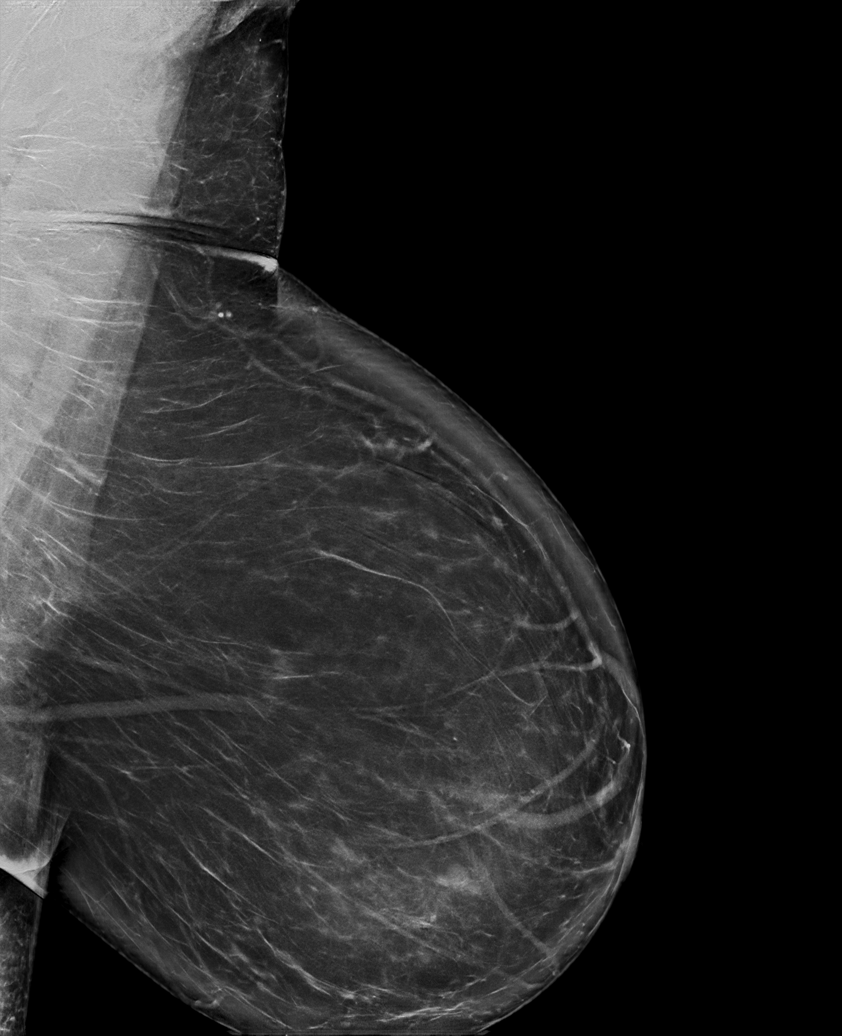

[L CC synth-2D]
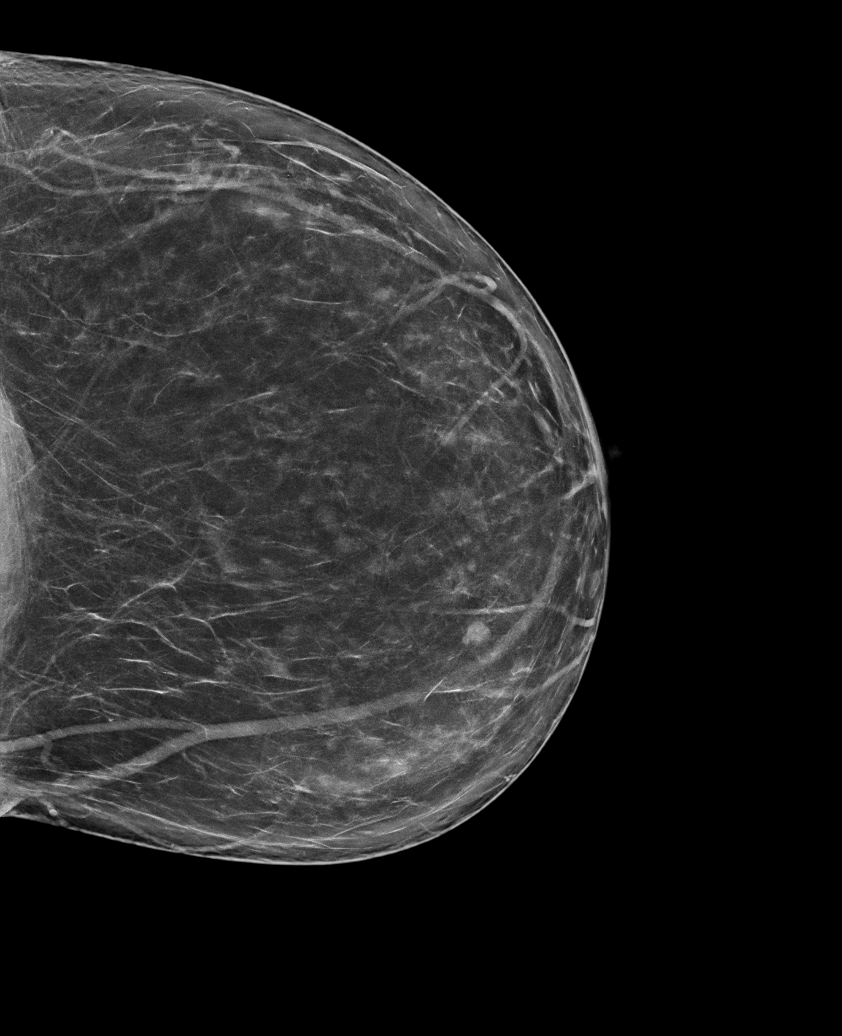

[R MLO tomo · tomo slice 44/87.0]
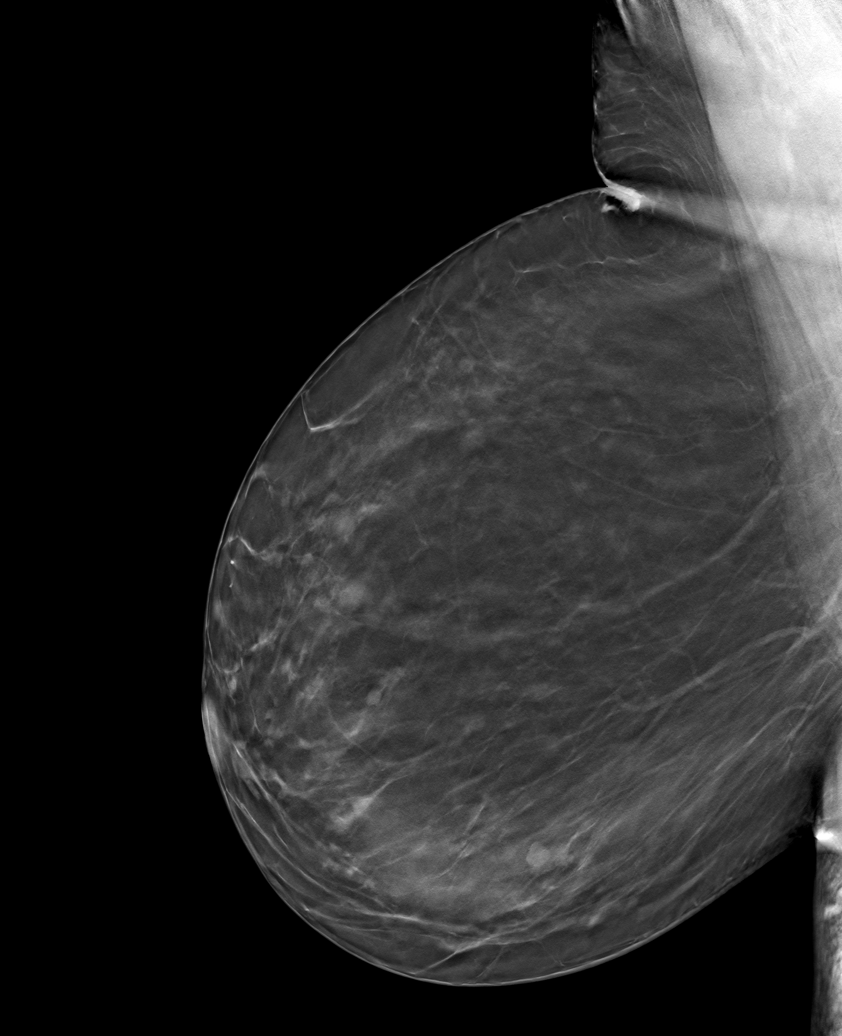

[6 of 30 positions shown; findings below may reference images not displayed]

ACR Breast Density Category b: There are scattered areas of
fibroglandular density.
FINDINGS: There are no findings suspicious for malignancy.
IMPRESSION: No mammographic evidence of malignancy. A result letter of this
screening mammogram will be mailed directly to the patient.

RECOMMENDATION:
Screening mammogram in one year. (Code:51-O-LD2)

BI-RADS CATEGORY  1: Negative.

## 2022-11-24 ENCOUNTER — Other Ambulatory Visit: Payer: BC Managed Care – PPO

## 2022-12-01 ENCOUNTER — Encounter: Payer: BC Managed Care – PPO | Admitting: Family Medicine

## 2022-12-08 ENCOUNTER — Other Ambulatory Visit: Payer: BC Managed Care – PPO

## 2022-12-08 DIAGNOSIS — Z0000 Encounter for general adult medical examination without abnormal findings: Secondary | ICD-10-CM

## 2022-12-08 DIAGNOSIS — Z13220 Encounter for screening for lipoid disorders: Secondary | ICD-10-CM

## 2022-12-08 DIAGNOSIS — E66811 Obesity, class 1: Secondary | ICD-10-CM

## 2022-12-08 DIAGNOSIS — R7303 Prediabetes: Secondary | ICD-10-CM

## 2022-12-13 ENCOUNTER — Encounter: Payer: BC Managed Care – PPO | Admitting: Family Medicine

## 2022-12-16 ENCOUNTER — Other Ambulatory Visit: Payer: BC Managed Care – PPO

## 2023-01-26 ENCOUNTER — Other Ambulatory Visit: Payer: BC Managed Care – PPO

## 2023-01-30 ENCOUNTER — Other Ambulatory Visit: Payer: Self-pay

## 2023-01-30 DIAGNOSIS — Z0000 Encounter for general adult medical examination without abnormal findings: Secondary | ICD-10-CM

## 2023-01-30 DIAGNOSIS — Z13220 Encounter for screening for lipoid disorders: Secondary | ICD-10-CM

## 2023-01-30 DIAGNOSIS — R7303 Prediabetes: Secondary | ICD-10-CM

## 2023-01-30 DIAGNOSIS — E66811 Obesity, class 1: Secondary | ICD-10-CM

## 2023-01-31 ENCOUNTER — Other Ambulatory Visit: Payer: 59

## 2023-02-01 LAB — COMPLETE METABOLIC PANEL WITH GFR
AG Ratio: 1.3 (calc) (ref 1.0–2.5)
ALT: 40 U/L — ABNORMAL HIGH (ref 6–29)
AST: 33 U/L (ref 10–35)
Albumin: 4 g/dL (ref 3.6–5.1)
Alkaline phosphatase (APISO): 79 U/L (ref 37–153)
BUN: 17 mg/dL (ref 7–25)
CO2: 29 mmol/L (ref 20–32)
Calcium: 9.3 mg/dL (ref 8.6–10.4)
Chloride: 103 mmol/L (ref 98–110)
Creat: 0.67 mg/dL (ref 0.50–1.03)
Globulin: 3.2 g/dL (ref 1.9–3.7)
Glucose, Bld: 128 mg/dL — ABNORMAL HIGH (ref 65–99)
Potassium: 4.2 mmol/L (ref 3.5–5.3)
Sodium: 139 mmol/L (ref 135–146)
Total Bilirubin: 0.4 mg/dL (ref 0.2–1.2)
Total Protein: 7.2 g/dL (ref 6.1–8.1)
eGFR: 105 mL/min/{1.73_m2} (ref >=60)

## 2023-02-01 LAB — CBC WITH DIFFERENTIAL/PLATELET
Absolute Lymphocytes: 2305 {cells}/uL (ref 850–3900)
Absolute Monocytes: 331 {cells}/uL (ref 200–950)
Basophils Absolute: 22 {cells}/uL (ref 0–200)
Basophils Relative: 0.5 %
Eosinophils Absolute: 181 {cells}/uL (ref 15–500)
Eosinophils Relative: 4.2 %
HCT: 35.8 % (ref 35.0–45.0)
Hemoglobin: 11.7 g/dL (ref 11.7–15.5)
MCH: 28.7 pg (ref 27.0–33.0)
MCHC: 32.7 g/dL (ref 32.0–36.0)
MCV: 87.7 fL (ref 80.0–100.0)
MPV: 10.7 fL (ref 7.5–12.5)
Monocytes Relative: 7.7 %
Neutro Abs: 1462 {cells}/uL — ABNORMAL LOW (ref 1500–7800)
Neutrophils Relative %: 34 %
Platelets: 369 10*3/uL (ref 140–400)
RBC: 4.08 10*6/uL (ref 3.80–5.10)
RDW: 12.9 % (ref 11.0–15.0)
Total Lymphocyte: 53.6 %
WBC: 4.3 10*3/uL (ref 3.8–10.8)

## 2023-02-01 LAB — LIPID PANEL
Cholesterol: 157 mg/dL (ref <200)
HDL: 48 mg/dL — ABNORMAL LOW (ref >=50)
LDL Cholesterol (Calc): 86 mg/dL
Non-HDL Cholesterol (Calc): 109 mg/dL (ref <130)
Total CHOL/HDL Ratio: 3.3 (calc) (ref <5.0)
Triglycerides: 132 mg/dL (ref <150)

## 2023-02-01 LAB — HEMOGLOBIN A1C
Hgb A1c MFr Bld: 6.4 %{Hb} — ABNORMAL HIGH (ref <5.7)
Mean Plasma Glucose: 137 mg/dL
eAG (mmol/L): 7.6 mmol/L

## 2023-02-01 LAB — TSH: TSH: 1.16 m[IU]/L

## 2023-02-02 ENCOUNTER — Encounter: Payer: BC Managed Care – PPO | Admitting: Family Medicine

## 2023-02-07 ENCOUNTER — Encounter: Payer: Self-pay | Admitting: Family Medicine

## 2023-02-07 ENCOUNTER — Ambulatory Visit (INDEPENDENT_AMBULATORY_CARE_PROVIDER_SITE_OTHER): Payer: 59 | Admitting: Family Medicine

## 2023-02-07 VITALS — BP 140/90 | HR 67 | Ht 64.0 in | Wt 217.0 lb

## 2023-02-07 DIAGNOSIS — Z0000 Encounter for general adult medical examination without abnormal findings: Secondary | ICD-10-CM

## 2023-02-07 DIAGNOSIS — I10 Essential (primary) hypertension: Secondary | ICD-10-CM | POA: Diagnosis not present

## 2023-02-07 DIAGNOSIS — R7303 Prediabetes: Secondary | ICD-10-CM | POA: Diagnosis not present

## 2023-02-07 MED ORDER — AMLODIPINE BESYLATE 5 MG PO TABS: 5.0000 mg | ORAL_TABLET | Freq: Every day | ORAL | 3 refills | Status: DC

## 2023-02-07 NOTE — Patient Instructions (Addendum)
Thank you for coming to the office today.  Consider GYN for consultation on hormone management for weight and hot flashes.  -------------------   WEIGHT MANAGEMENT  Dr Quillian Quince  [facility] [address] [place], [place] [address] Ph: [phone]  Let me know if this is the right fit for you.  ---------------------------------------------------------------  [employer] in [place], [place] Address: [address] [place], [place] [address] Open 24 hours Phone: [phone]  Personal Trainer, Diet Program, 6 week course  -----------------  [email or web address]  [employer]   Please schedule a Follow-up Appointment to: Return for 6 month PreDM A1c.  If you have any other questions or concerns, please feel free to call the office or send a message through MyChart. You may also schedule an earlier appointment if necessary.  Additionally, you may be receiving a survey about your experience at our office within a few days to 1 week by e-mail or mail. We value your feedback.  Saralyn Pilar, DO [facility], [place]

## 2023-02-07 NOTE — Progress Notes (Signed)
Subjective:    Patient ID: Caroline Wang, female    DOB: July 29, 1970, 53 y.o.   MRN: [id number]  Caroline Wang is a 53 y.o. female presenting on 02/07/2023 for Annual Exam   HPI  Discussed the use of AI scribe software for clinical note transcription with the patient, who gave verbal consent to proceed.  History of Present Illness    Pre-Diabetes Morbid Obesity BMI >37 Elevated BP with Hypertension Headaches Postmenopausal Vasomotor symptoms  The patient, with a history of hypertension and prediabetes, presented for an annual check-up. She reported a significant weight gain of 17 pounds since her last visit in May, which she attributed to dietary changes. The patient admitted to consuming convenience foods such as pizza and burgers for lunch and meals containing rice and potatoes for dinner. Despite this, she maintained a regular exercise routine, attending the gym three times a week and achieving approximately 10,000 steps on other days.  The patient also reported persistent frontal headaches, which she wakes up with and sometimes goes to bed with. She has tried over-the-counter medications such as Tylenol and Excedrin Migraine, but these have not provided significant relief. The patient expressed concern that these headaches might be related to her elevated blood pressure, which has been consistently high since she took phentermine. She noted that hypertension runs in her family, but she had not experienced issues until after taking phentermine.  In addition to these concerns, the patient reported experiencing night sweats, despite taking black cohosh. She expressed frustration with the lack of energy she has been feeling, which she believes may be related to her sleep disturbances and weight gain.  The patient expressed a strong preference for managing her health issues without medication, particularly her hypertension. However, she acknowledged that her blood pressure has been  consistently high and expressed willingness to consider short-term medication to manage it. She also expressed interest in exploring other resources for weight management, including online programs and potentially a referral to a weight management clinic.       Axillary Hidradenitis  Dr Ebony Cargo [facility])  Dr Hazle Quant [facility]) On Cosentyx Topical Clindamycin ointment twice a day for axillary   Health Maintenance:  Decline COVID Flu Shingrix.  Last Mammogram 08/25/22 negative.  Colonoscopy last done 07/12/21 negative, repeat 10 yr      05/24/2022    8:11 AM 07/28/2021    2:14 PM 06/09/2021    1:46 PM  Depression screen PHQ 2/9  Decreased Interest 0 1 0  Down, Depressed, Hopeless 0 0 0  PHQ - 2 Score 0 1 0  Altered sleeping  1 0  Tired, decreased energy  1 0  Change in appetite  0 0  Feeling bad or failure about yourself   0 0  Trouble concentrating  0 0  Moving slowly or fidgety/restless  0 0  Suicidal thoughts  0 0  PHQ-9 Score  3 0  Difficult doing work/chores  Not difficult at all Not difficult at all       05/24/2022    8:11 AM 07/28/2021    2:14 PM 06/09/2021    1:46 PM  GAD 7 : Generalized Anxiety Score  Nervous, Anxious, on Edge 0 0 0  Control/stop worrying 0 0 0  Worry too much - different things 0 0 0  Trouble relaxing 0 0 0  Restless 0 0 0  Easily annoyed or irritable 0 0 0  Afraid - awful might happen 0 0 0  Total GAD 7 Score 0 0 0  Anxiety Difficulty  Not difficult at all Not difficult at all     Past Medical History:  Diagnosis Date   Headache    Past Surgical History:  Procedure Laterality Date   CESAREAN SECTION     CHOLECYSTECTOMY     COLONOSCOPY N/A 07/12/2021   Procedure: COLONOSCOPY;  Surgeon: Wyline Mood, MD;  Location: [facility] ENDOSCOPY;  Service: Gastroenterology;  Laterality: N/A;   INCISION AND DRAINAGE ABSCESS Right 04/08/2022   Procedure: INCISION AND DRAINAGE ABSCESS;  Surgeon: Carolan Shiver, MD;  Location: [facility] ORS;  Service: General;  Laterality: Right;   SHOULDER ARTHROSCOPY DISTAL CLAVICLE EXCISION AND OPEN ROTATOR CUFF REPAIR     TOTAL ABDOMINAL HYSTERECTOMY  02/2004   Total Abdominal including ovaries, cervix - [facility]) Dr Talbert Cage   Social History   Socioeconomic History   Marital status: Single    Spouse name: Not on file   Number of children: 2   Years of education: Not on file   Highest education level: Not on file  Occupational History   Not on file  Tobacco Use   Smoking status: Never   Smokeless tobacco: Never  Vaping Use   Vaping status: Never Used  Substance and Sexual Activity   Alcohol use: Yes    Alcohol/week: 0.0 standard drinks of alcohol   Drug use: No   Sexual activity: Not on file  Other Topics Concern   Not on file  Social History Narrative   Not on file   Social Drivers of Health   Financial Resource Strain: Not on file  Food Insecurity: Not on file  Transportation Needs: Not on file  Physical Activity: Not on file  Stress: Not on file  Social Connections: Not on file  Intimate Partner Violence: Not on file   Family History  Problem Relation Age of Onset   Hypertension Mother    Hypertension Brother    Hypertension Maternal Grandmother    Other Maternal Aunt 11       Pre-Diabetes   Breast cancer Neg Hx    Diabetes Neg Hx    Colon cancer Neg Hx    Current Outpatient Medications on File Prior to Visit  Medication Sig   clindamycin (CLEOCIN T) 1 % external solution Apply topically.   fexofenadine (ALLERGY RELIEF) 180 MG tablet TAKE 1 TABLET(180 MG) BY MOUTH DAILY   montelukast (SINGULAIR) 10 MG tablet Take 1 tablet (10 mg total) by mouth at bedtime.   Secukinumab (COSENTYX [place]) Inject into the skin every 30 (thirty) days.   triamcinolone cream (KENALOG) 0.1 % Apply 1 application. topically 2 (two) times daily. For 1-2 weeks as needed for flares   No current facility-administered medications  on file prior to visit.    Review of Systems  Constitutional:  Negative for activity change, appetite change, chills, diaphoresis, fatigue and fever.  HENT:  Negative for congestion and hearing loss.   Eyes:  Negative for visual disturbance.  Respiratory:  Negative for cough, chest tightness, shortness of breath and wheezing.   Cardiovascular:  Negative for chest pain, palpitations and leg swelling.  Gastrointestinal:  Negative for abdominal pain, constipation, diarrhea, nausea and vomiting.  Genitourinary:  Negative for dysuria, frequency and hematuria.  Musculoskeletal:  Negative for arthralgias and neck pain.  Skin:  Negative for rash.  Neurological:  Negative for dizziness, weakness, light-headedness, numbness and headaches.  Hematological:  Negative for adenopathy.  Psychiatric/Behavioral:  Negative for behavioral problems,  dysphoric mood and sleep disturbance.    Per HPI unless specifically indicated above     Objective:    BP (!) 140/90 (BP Location: Left Arm, Cuff Size: Normal)   Pulse 67   Ht 5\' 4"  (1.626 m)   Wt 217 lb (98.4 kg)   SpO2 98%   BMI 37.25 kg/m   Wt Readings from Last 3 Encounters:  02/07/23 217 lb (98.4 kg)  05/24/22 200 lb (90.7 kg)  04/08/22 198 lb (89.8 kg)    Physical Exam Vitals and nursing note reviewed.  Constitutional:      General: She is not in acute distress.    Appearance: She is well-developed. She is obese. She is not diaphoretic.     Comments: Well-appearing, comfortable, cooperative  HENT:     Head: Normocephalic and atraumatic.  Eyes:     General:        Right eye: No discharge.        Left eye: No discharge.     Conjunctiva/sclera: Conjunctivae normal.     Pupils: Pupils are equal, round, and reactive to light.  Neck:     Thyroid: No thyromegaly.     Vascular: No carotid bruit.  Cardiovascular:     Rate and Rhythm: Normal rate and regular rhythm.     Pulses: Normal pulses.     Heart sounds: Normal heart sounds. No murmur  heard. Pulmonary:     Effort: Pulmonary effort is normal. No respiratory distress.     Breath sounds: Normal breath sounds. No wheezing or rales.  Abdominal:     General: Bowel sounds are normal. There is no distension.     Palpations: Abdomen is soft. There is no mass.     Tenderness: There is no abdominal tenderness.  Musculoskeletal:        General: No tenderness. Normal range of motion.     Cervical back: Normal range of motion and neck supple.     Right lower leg: No edema.     Left lower leg: No edema.     Comments: Upper / Lower Extremities: - Normal muscle tone, strength bilateral upper extremities 5/5, lower extremities 5/5  Lymphadenopathy:     Cervical: No cervical adenopathy.  Skin:    General: Skin is warm and dry.     Findings: No erythema or rash.  Neurological:     Mental Status: She is alert and oriented to person, place, and time.     Comments: Distal sensation intact to light touch all extremities  Psychiatric:        Mood and Affect: Mood normal.        Behavior: Behavior normal.        Thought Content: Thought content normal.     Comments: Well groomed, good eye contact, normal speech and thoughts     Results for orders placed or performed in visit on 01/30/23  COMPLETE METABOLIC PANEL WITH GFR   Collection Time: 01/31/23  8:21 AM  Result Value Ref Range   Glucose, Bld 128 (H) 65 - 99 mg/dL   BUN 17 7 - 25 mg/dL   Creat 1.61 0.96 - 0.45 mg/dL   eGFR 409 > OR = 60 WJ/XBJ/4.78G9   BUN/Creatinine Ratio SEE NOTE: 6 - 22 (calc)   Sodium 139 135 - 146 mmol/L   Potassium 4.2 3.5 - 5.3 mmol/L   Chloride 103 98 - 110 mmol/L   CO2 29 20 - 32 mmol/L   Calcium 9.3 8.6 - 10.4  mg/dL   Total Protein 7.2 6.1 - 8.1 g/dL   Albumin 4.0 3.6 - 5.1 g/dL   Globulin 3.2 1.9 - 3.7 g/dL (calc)   AG Ratio 1.3 1.0 - 2.5 (calc)   Total Bilirubin 0.4 0.2 - 1.2 mg/dL   Alkaline phosphatase (APISO) 79 37 - 153 U/L   AST 33 10 - 35 U/L   ALT 40 (H) 6 - 29 U/L  CBC with  Differential/Platelet   Collection Time: 01/31/23  8:21 AM  Result Value Ref Range   WBC 4.3 3.8 - 10.8 Thousand/uL   RBC 4.08 3.80 - 5.10 Million/uL   Hemoglobin 11.7 11.7 - 15.5 g/dL   HCT 78.2 95.6 - 21.3 %   MCV 87.7 80.0 - 100.0 fL   MCH 28.7 27.0 - 33.0 pg   MCHC 32.7 32.0 - 36.0 g/dL   RDW 08.6 57.8 - 46.9 %   Platelets 369 140 - 400 Thousand/uL   MPV 10.7 7.5 - 12.5 fL   Neutro Abs 1,462 (L) 1,500 - 7,800 cells/uL   Absolute Lymphocytes 2,305 850 - 3,900 cells/uL   Absolute Monocytes 331 200 - 950 cells/uL   Eosinophils Absolute 181 15 - 500 cells/uL   Basophils Absolute 22 0 - 200 cells/uL   Neutrophils Relative % 34 %   Total Lymphocyte 53.6 %   Monocytes Relative 7.7 %   Eosinophils Relative 4.2 %   Basophils Relative 0.5 %  Hemoglobin A1c   Collection Time: 01/31/23  8:21 AM  Result Value Ref Range   Hgb A1c MFr Bld 6.4 (H) <5.7 % of total Hgb   Mean Plasma Glucose 137 mg/dL   eAG (mmol/L) 7.6 mmol/L  Lipid panel   Collection Time: 01/31/23  8:21 AM  Result Value Ref Range   Cholesterol 157 <200 mg/dL   HDL 48 (L) > OR = 50 mg/dL   Triglycerides 629 <528 mg/dL   LDL Cholesterol (Calc) 86 mg/dL (calc)   Total CHOL/HDL Ratio 3.3 <5.0 (calc)   Non-HDL Cholesterol (Calc) 109 <130 mg/dL (calc)  TSH   Collection Time: 01/31/23  8:21 AM  Result Value Ref Range   TSH 1.16 mIU/L      Assessment & Plan:   Problem List Items Addressed This Visit     Essential hypertension   Relevant Medications   amLODipine (NORVASC) 5 MG tablet   Morbid obesity (HCC)   Pre-diabetes   Other Visit Diagnoses       Annual physical exam    -  Primary        Updated Health Maintenance information Reviewed recent lab results with patient Encouraged improvement to lifestyle with diet and exercise Goal of weight loss   Hypertension Persistent elevated blood pressure readings, family history of hypertension. Patient reports headaches and has a history of phentermine use  believed to have triggered worsening BP.  Discussed the importance of consistent blood pressure control and the potential link between hypertension and headaches. -Start Amlodipine 5mg  daily. Counsel on possible swelling LE. Goal to come off med if wt loss in future -Check blood pressure in 4 months.  Morbid Obesity BMI >37 Abnormal Weight Gain Patient reports a weight gain of 17 pounds since last visit in May. Discussed the potential impact of weight gain on blood pressure and overall health. Patient is interested in non-pharmacological weight management strategies. -Encouraged to explore weight management programs such as [facility] in [place] and [facility] in [place]. -Consider online resources such as [employer] and Weight  Watchers.  Prediabetes A1c increased from 5.5 to 6.4. Discussed the importance of lifestyle modifications in managing prediabetes. -Encouraged to continue with regular exercise and healthy diet. -Check A1c in 3-6 months.  Postmenopausal Vasomotor Sweating / Hot Flashes Patient reports persistent night sweats. Discussed potential hormonal imbalance due to menopause and the potential benefits of hormone treatments. -Consider consultation with a Gynecologist for potential hormone treatments.  Headaches Patient reports frequent frontal headaches. Discussed potential causes including tension and high blood pressure. -Continue over-the-counter headache medications as needed. -Monitor response to blood pressure control.  General Health Maintenance -Declined flu shot. -Consider coronary artery calcium score for heart health assessment.         No orders of the defined types were placed in this encounter.   Meds ordered this encounter  Medications   amLODipine (NORVASC) 5 MG tablet    Sig: Take 1 tablet (5 mg total) by mouth daily.    Dispense:  30 tablet    Refill:  3     Follow up plan: Return for 6 month PreDM A1c.  Saralyn Pilar,  DO [facility] 02/07/2023, 3:05 PM

## 2023-02-27 ENCOUNTER — Encounter: Payer: BC Managed Care – PPO | Admitting: Family Medicine

## 2023-06-21 ENCOUNTER — Ambulatory Visit
Admission: RE | Admit: 2023-06-21 | Discharge: 2023-06-21 | Disposition: A | Discharge status: Home / Self Care | Payer: Self-pay | Source: Ambulatory Visit | Attending: Family Medicine | Admitting: Family Medicine

## 2023-06-21 ENCOUNTER — Ambulatory Visit: Payer: Self-pay | Admitting: Family Medicine

## 2023-06-21 ENCOUNTER — Encounter: Payer: Self-pay | Admitting: Family Medicine

## 2023-06-21 VITALS — BP 132/4 | HR 78 | Ht 64.0 in | Wt 212.5 lb

## 2023-06-21 DIAGNOSIS — R102 Pelvic and perineal pain: Secondary | ICD-10-CM

## 2023-06-21 DIAGNOSIS — M6289 Other specified disorders of muscle: Secondary | ICD-10-CM

## 2023-06-21 DIAGNOSIS — R350 Frequency of micturition: Secondary | ICD-10-CM

## 2023-06-21 MED ORDER — NAPROXEN 500 MG PO TABS: 500.0000 mg | ORAL_TABLET | Freq: Two times a day (BID) | ORAL | 0 refills | Status: DC

## 2023-06-21 MED ORDER — CEPHALEXIN 500 MG PO CAPS: 500.0000 mg | ORAL_CAPSULE | Freq: Three times a day (TID) | ORAL | 0 refills | Status: DC

## 2023-06-21 NOTE — Patient Instructions (Addendum)
 Thank you for coming to the office today.  X-ray today, results later to check bladder and pelvic / abdominal region  Next we can consider Pelvic Ultrasound  Also consider referral to UroGYN or Urologist or Pelvic Floor Therapist.  Recommend trial of Anti-inflammatory with Naproxen (Naprosyn) 500mg  tabs - take one with food and plenty of water TWICE daily every day (breakfast and dinner), for next 2 to 4 weeks, then you may take only as needed - DO NOT TAKE any ibuprofen, aleve, motrin while you are taking this medicine - It is safe to take Tylenol  Ext Str 500mg  tabs - take 1 to 2 (max dose 1000mg ) every 6 hours as needed for breakthrough pain, max 24 hour daily dose is 6 to 8 tablets or 4000mg   Urinalysis, and Urine culture, rule out infection.   Please schedule a Follow-up Appointment to: Return if symptoms worsen or fail to improve.  If you have any other questions or concerns, please feel free to call the office or send a message through MyChart. You may also schedule an earlier appointment if necessary.  Additionally, you may be receiving a survey about your experience at our office within a few days to 1 week by e-mail or mail. We value your feedback.  Domingo Friend, DO [facility], [place]

## 2023-06-21 NOTE — Addendum Note (Signed)
 Addended by: Raina Bunting on: 06/21/2023 05:54 PM   Modules accepted: Level of Service

## 2023-06-21 NOTE — Progress Notes (Signed)
 Subjective:    Patient ID: Caroline Wang, female    DOB: January 16, 1971, 53 y.o.   MRN: [id number]  Caroline Wang is a 53 y.o. female presenting on 06/21/2023 for Pelvic Pain   HPI  Discussed the use of AI scribe software for clinical note transcription with the patient, who gave verbal consent to proceed.  History of Present Illness   Caroline BILLITER "Pam" is a 53 year old female who presents with lower abdominal pain and pressure.  She has been experiencing lower abdominal pain described as a heavy pressure since Sunday, and still present now. The pain is exacerbated by standing, sitting, and coughing, but not by urination or eating. Movement and activity seems to alleviate the discomfort. No burning sensation during urination, fever, nausea, vomiting, or changes in bowel habits.  She has been drinking cranberry juice and water, and tried Azo without relief. Ibuprofen provided some relief, allowing her to get through a workday, but the pain returned after the medication wore off. She has not taken any Azo today.  Her past surgical history includes a C-section, gallbladder removal, and hysterectomy. She has a history of seizures. No urinary leakage, blood in the urine, or changes in urine color. Regular bowel movements without constipation or diarrhea.  She does not notice any pelvic vaginal prolapse.  She works from home and has not been engaging in heavy lifting due to a shoulder injury in December.       Past Surgical History:  Procedure Laterality Date   CESAREAN SECTION     CHOLECYSTECTOMY     COLONOSCOPY N/A 07/12/2021   Procedure: COLONOSCOPY;  Surgeon: Luke Salaam, MD;  Location: [facility] ENDOSCOPY;  Service: Gastroenterology;  Laterality: N/A;   INCISION AND DRAINAGE ABSCESS Right 04/08/2022   Procedure: INCISION AND DRAINAGE ABSCESS;  Surgeon: Eldred Grego, MD;  Location: [facility] ORS;  Service: General;  Laterality: Right;   SHOULDER ARTHROSCOPY DISTAL CLAVICLE EXCISION  AND OPEN ROTATOR CUFF REPAIR     TOTAL ABDOMINAL HYSTERECTOMY  02/2004   Total Abdominal including ovaries, cervix - [facility] Dr Kieth Pelt        06/21/2023   11:19 AM 05/24/2022    8:11 AM 07/28/2021    2:14 PM  Depression screen PHQ 2/9  Decreased Interest 0 0 1  Down, Depressed, Hopeless 0 0 0  PHQ - 2 Score 0 0 1  Altered sleeping 1  1  Tired, decreased energy 0  1  Change in appetite 1  0  Feeling bad or failure about yourself  0  0  Trouble concentrating 0  0  Moving slowly or fidgety/restless 0  0  Suicidal thoughts 0  0  PHQ-9 Score 2  3  Difficult doing work/chores Not difficult at all  Not difficult at all       06/21/2023   11:19 AM 05/24/2022    8:11 AM 07/28/2021    2:14 PM 06/09/2021    1:46 PM  GAD 7 : Generalized Anxiety Score  Nervous, Anxious, on Edge 0 0 0 0  Control/stop worrying 0 0 0 0  Worry too much - different things 0 0 0 0  Trouble relaxing 0 0 0 0  Restless 0 0 0 0  Easily annoyed or irritable 0 0 0 0  Afraid - awful might happen 0 0 0 0  Total GAD 7 Score 0 0 0 0  Anxiety Difficulty Not difficult at all  Not difficult at all Not  difficult at all    Social History   Tobacco Use   Smoking status: Never   Smokeless tobacco: Never  Vaping Use   Vaping status: Never Used  Substance Use Topics   Alcohol use: Yes    Alcohol/week: 0.0 standard drinks of alcohol   Drug use: No    Review of Systems Per HPI unless specifically indicated above     Objective:     BP (!) 132/4 (BP Location: Left Arm, Patient Position: Sitting, Cuff Size: Normal)   Pulse 78   Ht 5\' 4"  (1.626 m)   Wt 212 lb 8 oz (96.4 kg)   SpO2 99%   BMI 36.48 kg/m   Wt Readings from Last 3 Encounters:  06/21/23 212 lb 8 oz (96.4 kg)  02/07/23 217 lb (98.4 kg)  05/24/22 200 lb (90.7 kg)    Physical Exam Vitals and nursing note reviewed.  Constitutional:      General: She is not in acute distress.    Appearance: She is well-developed. She is not  diaphoretic.     Comments: Well-appearing, comfortable, cooperative  HENT:     Head: Normocephalic and atraumatic.  Eyes:     General:        Right eye: No discharge.        Left eye: No discharge.     Conjunctiva/sclera: Conjunctivae normal.  Neck:     Thyroid: No thyromegaly.  Cardiovascular:     Rate and Rhythm: Normal rate and regular rhythm.     Heart sounds: Normal heart sounds. No murmur heard. Pulmonary:     Effort: Pulmonary effort is normal. No respiratory distress.     Breath sounds: Normal breath sounds. No wheezing or rales.  Abdominal:     General: Bowel sounds are normal. There is no distension.     Palpations: There is no mass.     Tenderness: There is abdominal tenderness (lower abdomen vs pelvic midline and slightly L and R. tender to palpation.). There is no guarding or rebound.     Hernia: No hernia (No evidence of hernia) is present.  Musculoskeletal:        General: Normal range of motion.     Cervical back: Normal range of motion and neck supple.  Lymphadenopathy:     Cervical: No cervical adenopathy.  Skin:    General: Skin is warm and dry.     Findings: No erythema or rash.  Neurological:     Mental Status: She is alert and oriented to person, place, and time.  Psychiatric:        Behavior: Behavior normal.     Comments: Well groomed, good eye contact, normal speech and thoughts     I have personally reviewed the radiology report from 06/21/23 on [facility] X-ray.  CLINICAL DATA:  Acute pelvic pain.   EXAM: ABDOMEN - 1 VIEW   COMPARISON:  None Available.   FINDINGS: The bowel gas pattern is normal. Phleboliths are noted in the pelvis. Surgical clips seen in left side of abdomen.   IMPRESSION: No abnormal bowel dilatation.     Electronically Signed   By: Rosalene Colon M.D.   On: 06/21/2023 12:23  Results for orders placed or performed in visit on 01/30/23  COMPLETE METABOLIC PANEL WITH GFR   Collection Time: 01/31/23  8:21 AM  Result  Value Ref Range   Glucose, Bld 128 (H) 65 - 99 mg/dL   BUN 17 7 - 25 mg/dL   Creat  0.67 0.50 - 1.03 mg/dL   eGFR 161 > OR = 60 WR/UEA/5.40J8   BUN/Creatinine Ratio SEE NOTE: 6 - 22 (calc)   Sodium 139 135 - 146 mmol/L   Potassium 4.2 3.5 - 5.3 mmol/L   Chloride 103 98 - 110 mmol/L   CO2 29 20 - 32 mmol/L   Calcium 9.3 8.6 - 10.4 mg/dL   Total Protein 7.2 6.1 - 8.1 g/dL   Albumin 4.0 3.6 - 5.1 g/dL   Globulin 3.2 1.9 - 3.7 g/dL (calc)   AG Ratio 1.3 1.0 - 2.5 (calc)   Total Bilirubin 0.4 0.2 - 1.2 mg/dL   Alkaline phosphatase (APISO) 79 37 - 153 U/L   AST 33 10 - 35 U/L   ALT 40 (H) 6 - 29 U/L  CBC with Differential/Platelet   Collection Time: 01/31/23  8:21 AM  Result Value Ref Range   WBC 4.3 3.8 - 10.8 Thousand/uL   RBC 4.08 3.80 - 5.10 Million/uL   Hemoglobin 11.7 11.7 - 15.5 g/dL   HCT 11.9 14.7 - 82.9 %   MCV 87.7 80.0 - 100.0 fL   MCH 28.7 27.0 - 33.0 pg   MCHC 32.7 32.0 - 36.0 g/dL   RDW 56.2 13.0 - 86.5 %   Platelets 369 140 - 400 Thousand/uL   MPV 10.7 7.5 - 12.5 fL   Neutro Abs 1,462 (L) 1,500 - 7,800 cells/uL   Absolute Lymphocytes 2,305 850 - 3,900 cells/uL   Absolute Monocytes 331 200 - 950 cells/uL   Eosinophils Absolute 181 15 - 500 cells/uL   Basophils Absolute 22 0 - 200 cells/uL   Neutrophils Relative % 34 %   Total Lymphocyte 53.6 %   Monocytes Relative 7.7 %   Eosinophils Relative 4.2 %   Basophils Relative 0.5 %  Hemoglobin A1c   Collection Time: 01/31/23  8:21 AM  Result Value Ref Range   Hgb A1c MFr Bld 6.4 (H) <5.7 % of total Hgb   Mean Plasma Glucose 137 mg/dL   eAG (mmol/L) 7.6 mmol/L  Lipid panel   Collection Time: 01/31/23  8:21 AM  Result Value Ref Range   Cholesterol 157 <200 mg/dL   HDL 48 (L) > OR = 50 mg/dL   Triglycerides 784 <696 mg/dL   LDL Cholesterol (Calc) 86 mg/dL (calc)   Total CHOL/HDL Ratio 3.3 <5.0 (calc)   Non-HDL Cholesterol (Calc) 109 <130 mg/dL (calc)  TSH   Collection Time: 01/31/23  8:21 AM  Result Value Ref  Range   TSH 1.16 mIU/L      Assessment & Plan:   Problem List Items Addressed This Visit   None Visit Diagnoses       Pelvic pain    -  Primary   Relevant Medications   naproxen (NAPROSYN) 500 MG tablet   cephALEXin (KEFLEX) 500 MG capsule   Other Relevant Orders   Urine Culture   Urinalysis, Routine w reflex microscopic   DG Abd 1 View (Completed)     Pelvic floor dysfunction in female       Relevant Orders   DG Abd 1 View (Completed)     Urinary frequency       Relevant Medications   cephALEXin (KEFLEX) 500 MG capsule   Other Relevant Orders   Urine Culture   Urinalysis, Routine w reflex microscopic   DG Abd 1 View (Completed)        Pelvic Pain & Pressure Pelvic pain likely due to structural issues such as pelvic  floor dysfunction or early prolapse. Urinary tract infection less likely due to absence of urinary symptoms but she has some frequency and cannot rule out. Prior abdominal surgeries may contribute to pelvic floor dysfunction. She has had prior hysterectomy, cholecystectomy, C-section  - Order urinalysis and urine culture to rule out infection. - Order [facility] x-ray to assess for structural abnormalities. - Result is negative, called to patient, reviewed, she prefers to wait on urinalysis / urine culture before pursuing other imaging pelvic transvaginal US  or CT  - Prescribe naproxen 500 mg, one pill twice a day, for pain management. - Provide printed back up prescription for Keflex 500 mg, three times a day for seven days, if urinalysis indicates infection. - Consider referral to a urologist or urogynecologist if imaging suggests structural issues.  Follow-up Follow-up necessary to review urinalysis, urine culture, and x-ray results to determine next steps. - Review urinalysis and x-ray results by the end of the day or the next morning. - Follow up on urine culture results in 48-72 hours. - Access and review results on Friday or by Monday morning. -  Consider further imaging or specialist referral based on results.        Orders Placed This Encounter  Procedures   Urine Culture   DG Abd 1 View    Standing Status:   Future    Number of Occurrences:   1    Expiration Date:   09/21/2023    Reason for Exam (SYMPTOM  OR DIAGNOSIS REQUIRED):   pelvic pain floor and bladder dysfunction    Is the patient pregnant?:   Yes    Preferred imaging location?:   [facility]   Urinalysis, Routine w reflex microscopic    Meds ordered this encounter  Medications   naproxen (NAPROSYN) 500 MG tablet    Sig: Take 1 tablet (500 mg total) by mouth 2 (two) times daily with a meal. For 1-2 weeks then as needed    Dispense:  60 tablet    Refill:  0   cephALEXin (KEFLEX) 500 MG capsule    Sig: Take 1 capsule (500 mg total) by mouth 3 (three) times daily. For 7 days    Dispense:  21 capsule    Refill:  0    Follow up plan: Return if symptoms worsen or fail to improve.  Caroline Friend, DO [facility] 06/21/2023, 11:31 AM

## 2023-06-22 LAB — URINALYSIS, ROUTINE W REFLEX MICROSCOPIC
Bilirubin Urine: NEGATIVE
Glucose, UA: NEGATIVE
Hgb urine dipstick: NEGATIVE
Ketones, ur: NEGATIVE
Leukocytes,Ua: NEGATIVE
Nitrite: NEGATIVE
Protein, ur: NEGATIVE
Specific Gravity, Urine: 1.017 (ref 1.001–1.035)
pH: 5.5 (ref 5.0–8.0)

## 2023-06-22 LAB — URINE CULTURE
MICRO NUMBER:: 16539714
Result:: NO GROWTH
SPECIMEN QUALITY:: ADEQUATE

## 2023-08-07 ENCOUNTER — Ambulatory Visit: Payer: Self-pay | Admitting: Family Medicine

## 2023-08-07 ENCOUNTER — Telehealth: Payer: Self-pay

## 2023-08-07 VITALS — BP 148/82 | HR 64 | Ht 64.0 in | Wt 216.0 lb

## 2023-08-07 DIAGNOSIS — R7309 Other abnormal glucose: Secondary | ICD-10-CM | POA: Diagnosis not present

## 2023-08-07 DIAGNOSIS — I10 Essential (primary) hypertension: Secondary | ICD-10-CM

## 2023-08-07 DIAGNOSIS — R7303 Prediabetes: Secondary | ICD-10-CM

## 2023-08-07 LAB — POCT GLYCOSYLATED HEMOGLOBIN (HGB A1C): Hemoglobin A1C: 5.7 % — AB (ref 4.0–5.6)

## 2023-08-07 MED ORDER — AMLODIPINE BESYLATE 5 MG PO TABS: 5.0000 mg | ORAL_TABLET | Freq: Every day | ORAL | 3 refills | Status: DC

## 2023-08-07 NOTE — Patient Instructions (Addendum)
 Thank you for coming to the office today.  BP still elevated, refilled the medication. Keep a close watch.  [email or web address]  Check with insurance for coverage as well if you prefer.  Recent Labs    01/31/23 0821 08/07/23 0918  HGBA1C 6.4* 5.7*   BP stable, refill medication  Remain off Allergy meds  Consider Shingrix vaccine in future  FUTURE CONSIDERATION Coronary Calcium Score Cardiac CT Scan. This is a screening test for patients aged 53-50+ with cardiovascular risk factors or who are healthy but would be interested in Cardiovascular Screening for heart disease. Even if there is a family history of heart disease, this imaging can be useful. Typically it can be done every 5+ years or at a different timeline we agree on  The scan will look at the chest and mainly focus on the heart and identify early signs of calcium build up or blockages within the heart arteries. It is not 100% accurate for identifying blockages or heart disease, but it is useful to help us  predict who may have some early changes or be at risk in the future for a heart attack or cardiovascular problem.  The results are reviewed by a Cardiologist and they will document the results. It should become available on MyChart. Typically the results are divided into percentiles based on other patients of the same demographic and age. So it will compare your risk to others similar to you. If you have a higher score >99 or higher percentile >75%tile, it is recommended to consider Statin cholesterol therapy and or referral to Cardiologist. I will try to help explain your results and if we have questions we can contact the Cardiologist.  You will be contacted for scheduling. Usually it is done at any imaging facility through [facility], [facility] or [facility].  The cost is $99 flat fee total and it does not go through insurance, so no authorization is required.   DUE for FASTING  BLOOD WORK (no food or drink after midnight before the lab appointment, only water or coffee without cream/sugar on the morning of)  SCHEDULE Lab Only visit in the morning at the clinic for lab draw in 6 MONTHS   - Make sure Lab Only appointment is at about 1 week before your next appointment, so that results will be available  For Lab Results, once available within 2-3 days of blood draw, you can can log in to MyChart online to view your results and a brief explanation. Also, we can discuss results at next follow-up visit.   Please schedule a Follow-up Appointment to: Return in about 6 months (around 02/07/2024) for 6 month fasting lab > 1 week later Annual Physical.  If you have any other questions or concerns, please feel free to call the office or send a message through MyChart. You may also schedule an earlier appointment if necessary.  Additionally, you may be receiving a survey about your experience at our office within a few days to 1 week by e-mail or mail. We value your feedback.  Marsa Officer, DO [facility], [place]

## 2023-08-07 NOTE — Telephone Encounter (Signed)
 Copied from CRM [id number]. Topic: General - Running Late >> Aug 07, 2023  8:43 AM Mesmerise C wrote: Patient/patient representative is calling because they are running late for an appointment.

## 2023-08-07 NOTE — Progress Notes (Unsigned)
 Subjective:    Patient ID: Caroline Wang, female    DOB: 1970-02-14, 53 y.o.   MRN: [id number]  Caroline Wang is a 53 y.o. female presenting on 08/07/2023 for Medical Management of Chronic Issues   HPI  Pre-Diabetes Morbid Obesity BMI >37 Elevated BP with Hypertension Headaches Postmenopausal Vasomotor symptoms  6 month weight change down 1 lbs from 217 to 216 lbs. Home BP readings 120-140 / 70-90, monitoring most days Currently taking Amlodipine  5mg  daily    The patient, with a history of hypertension and prediabetes, presented for an annual check-up. She reported a significant weight gain of 17 pounds since her last visit in May, which she attributed to dietary changes. The patient admitted to consuming convenience foods such as pizza and burgers for lunch and meals containing rice and potatoes for dinner. Despite this, she maintained a regular exercise routine, attending the gym three times a week and achieving approximately 10,000 steps on other days.   The patient also reported persistent frontal headaches, which she wakes up with and sometimes goes to bed with. She has tried over-the-counter medications such as Tylenol  and Excedrin Migraine, but these have not provided significant relief. The patient expressed concern that these headaches might be related to her elevated blood pressure, which has been consistently high since she took phentermine . She noted that hypertension runs in her family, but she had not experienced issues until after taking phentermine .   In addition to these concerns, the patient reported experiencing night sweats, despite taking black cohosh. She expressed frustration with the lack of energy she has been feeling, which she believes may be related to her sleep disturbances and weight gain.   The patient expressed a strong preference for managing her health issues without medication, particularly her hypertension. However, she acknowledged that her blood  pressure has been consistently high and expressed willingness to consider short-term medication to manage it. She also expressed interest in exploring other resources for weight management, including online programs and potentially a referral to a weight management clinic.        Axillary Hidradenitis  Dr Cath[facility])  Dr Rod[facility]) On Cosentyx Topical Clindamycin ointment twice a day for axillary   Health Maintenance:  Next Mammogram in Summer 2025  Considering Shingrix vaccine     06/21/2023   11:19 AM 05/24/2022    8:11 AM 07/28/2021    2:14 PM  Depression screen PHQ 2/9  Decreased Interest 0 0 1  Down, Depressed, Hopeless 0 0 0  PHQ - 2 Score 0 0 1  Altered sleeping 1  1  Tired, decreased energy 0  1  Change in appetite 1  0  Feeling bad or failure about yourself  0  0  Trouble concentrating 0  0  Moving slowly or fidgety/restless 0  0  Suicidal thoughts 0  0  PHQ-9 Score 2  3  Difficult doing work/chores Not difficult at all  Not difficult at all       06/21/2023   11:19 AM 05/24/2022    8:11 AM 07/28/2021    2:14 PM 06/09/2021    1:46 PM  GAD 7 : Generalized Anxiety Score  Nervous, Anxious, on Edge 0 0 0 0  Control/stop worrying 0 0 0 0  Worry too much - different things 0 0 0 0  Trouble relaxing 0 0 0 0  Restless 0 0 0 0  Easily annoyed or irritable 0 0 0 0  Afraid - awful might happen 0 0 0 0  Total GAD 7 Score 0 0 0 0  Anxiety Difficulty Not difficult at all  Not difficult at all Not difficult at all    Social History   Tobacco Use   Smoking status: Never   Smokeless tobacco: Never  Vaping Use   Vaping status: Never Used  Substance Use Topics   Alcohol use: Yes    Alcohol/week: 0.0 standard drinks of alcohol   Drug use: No    Review of Systems Per HPI unless specifically indicated above     Objective:    BP (!) 148/82 (BP Location: Left Arm, Cuff Size: Normal)   Pulse 64   Ht 5' 4 (1.626  m)   Wt 216 lb (98 kg)   SpO2 96%   BMI 37.08 kg/m   Wt Readings from Last 3 Encounters:  08/07/23 216 lb (98 kg)  06/21/23 212 lb 8 oz (96.4 kg)  02/07/23 217 lb (98.4 kg)    Physical Exam  Results for orders placed or performed in visit on 08/07/23  POCT HgB A1C   Collection Time: 08/07/23  9:18 AM  Result Value Ref Range   Hemoglobin A1C 5.7 (A) 4.0 - 5.6 %   HbA1c POC (<> result, manual entry)     HbA1c, POC (prediabetic range)     HbA1c, POC (controlled diabetic range)        Assessment & Plan:   Problem List Items Addressed This Visit     Elevated hemoglobin A1c   Essential hypertension   Relevant Medications   amLODipine  (NORVASC ) 5 MG tablet   Morbid obesity (HCC)   Pre-diabetes - Primary   Relevant Orders   POCT HgB A1C (Completed)     ***  Orders Placed This Encounter  Procedures   POCT HgB A1C    Meds ordered this encounter  Medications   amLODipine  (NORVASC ) 5 MG tablet    Sig: Take 1 tablet (5 mg total) by mouth daily.    Dispense:  90 tablet    Refill:  3    Add refills 90 day    Follow up plan: Return in about 6 months (around 02/07/2024) for 6 month fasting lab > 1 week later Annual Physical.  Future labs ordered for ***  Marsa Officer, DO [facility] 08/07/2023, 9:12 AM

## 2023-09-29 ENCOUNTER — Other Ambulatory Visit: Payer: Self-pay | Admitting: Medical Genetics

## 2023-10-06 ENCOUNTER — Other Ambulatory Visit: Payer: Self-pay | Admitting: Family Medicine

## 2023-10-06 DIAGNOSIS — Z1231 Encounter for screening mammogram for malignant neoplasm of breast: Secondary | ICD-10-CM

## 2023-10-10 ENCOUNTER — Ambulatory Visit
Admission: RE | Admit: 2023-10-10 | Discharge: 2023-10-10 | Disposition: A | Discharge status: Home / Self Care | Source: Ambulatory Visit | Attending: Family Medicine | Admitting: Family Medicine

## 2023-10-10 ENCOUNTER — Encounter

## 2023-10-10 DIAGNOSIS — Z1231 Encounter for screening mammogram for malignant neoplasm of breast: Secondary | ICD-10-CM

## 2023-10-12 ENCOUNTER — Encounter (INDEPENDENT_AMBULATORY_CARE_PROVIDER_SITE_OTHER): Payer: Self-pay

## 2023-11-03 ENCOUNTER — Other Ambulatory Visit: Payer: Self-pay

## 2023-11-03 DIAGNOSIS — I10 Essential (primary) hypertension: Secondary | ICD-10-CM

## 2023-11-03 MED ORDER — AMLODIPINE BESYLATE 5 MG PO TABS: 5.0000 mg | ORAL_TABLET | Freq: Every day | ORAL | 3 refills | Status: AC

## 2023-12-11 ENCOUNTER — Ambulatory Visit: Payer: Self-pay

## 2023-12-11 ENCOUNTER — Emergency Department

## 2023-12-11 ENCOUNTER — Emergency Department
Admission: EM | Admit: 2023-12-11 | Discharge: 2023-12-11 | Disposition: A | Discharge status: Home / Self Care | Attending: Emergency Medicine | Admitting: Emergency Medicine

## 2023-12-11 ENCOUNTER — Other Ambulatory Visit: Payer: Self-pay

## 2023-12-11 ENCOUNTER — Ambulatory Visit
Admission: EM | Admit: 2023-12-11 | Discharge: 2023-12-11 | Disposition: A | Discharge status: Home / Self Care | Attending: Emergency Medicine | Admitting: Emergency Medicine

## 2023-12-11 DIAGNOSIS — R9431 Abnormal electrocardiogram [ECG] [EKG]: Secondary | ICD-10-CM | POA: Insufficient documentation

## 2023-12-11 DIAGNOSIS — R079 Chest pain, unspecified: Secondary | ICD-10-CM | POA: Diagnosis present

## 2023-12-11 DIAGNOSIS — I10 Essential (primary) hypertension: Secondary | ICD-10-CM | POA: Insufficient documentation

## 2023-12-11 DIAGNOSIS — K209 Esophagitis, unspecified without bleeding: Secondary | ICD-10-CM | POA: Insufficient documentation

## 2023-12-11 DIAGNOSIS — R1013 Epigastric pain: Secondary | ICD-10-CM | POA: Diagnosis present

## 2023-12-11 HISTORY — DX: Essential (primary) hypertension: I10

## 2023-12-11 LAB — CBC
HCT: 37.6 % (ref 36.0–46.0)
Hemoglobin: 12.4 g/dL (ref 12.0–15.0)
MCH: 28.5 pg (ref 26.0–34.0)
MCHC: 33 g/dL (ref 30.0–36.0)
MCV: 86.4 fL (ref 80.0–100.0)
Platelets: 353 K/uL (ref 150–400)
RBC: 4.35 MIL/uL (ref 3.87–5.11)
RDW: 12.6 % (ref 11.5–15.5)
WBC: 4.8 K/uL (ref 4.0–10.5)
nRBC: 0 % (ref 0.0–0.2)

## 2023-12-11 LAB — BASIC METABOLIC PANEL WITH GFR
Anion gap: 9 (ref 5–15)
BUN: 13 mg/dL (ref 6–20)
CO2: 27 mmol/L (ref 22–32)
Calcium: 9.7 mg/dL (ref 8.9–10.3)
Chloride: 103 mmol/L (ref 98–111)
Creatinine, Ser: 0.8 mg/dL (ref 0.44–1.00)
GFR, Estimated: >60 mL/min (ref >60)
Glucose, Bld: 97 mg/dL (ref 70–99)
Potassium: 4.2 mmol/L (ref 3.5–5.1)
Sodium: 139 mmol/L (ref 135–145)

## 2023-12-11 LAB — LIPASE, BLOOD: Lipase: 29 U/L (ref 11–51)

## 2023-12-11 LAB — TROPONIN T, HIGH SENSITIVITY
Troponin T High Sensitivity: <15 ng/L (ref 0–19)
Troponin T High Sensitivity: <15 ng/L (ref 0–19)

## 2023-12-11 LAB — HEPATIC FUNCTION PANEL
ALT: 18 U/L (ref 0–44)
AST: 25 U/L (ref 15–41)
Albumin: 4.3 g/dL (ref 3.5–5.0)
Alkaline Phosphatase: 97 U/L (ref 38–126)
Bilirubin, Direct: 0.2 mg/dL (ref 0.0–0.2)
Indirect Bilirubin: 0.4 mg/dL (ref 0.3–0.9)
Total Bilirubin: 0.6 mg/dL (ref 0.0–1.2)
Total Protein: 8 g/dL (ref 6.5–8.1)

## 2023-12-11 MED ORDER — ALUM & MAG HYDROXIDE-SIMETH 200-200-20 MG/5ML PO SUSP
30.0000 mL | Freq: Once | ORAL | Status: AC
  Administered 2023-12-11: 30 mL via ORAL
  Filled 2023-12-11: qty 30

## 2023-12-11 MED ORDER — PANTOPRAZOLE SODIUM 40 MG PO TBEC: 40.0000 mg | DELAYED_RELEASE_TABLET | Freq: Every day | ORAL | 1 refills | Status: AC

## 2023-12-11 MED ORDER — LIDOCAINE VISCOUS HCL 2 % MT SOLN
15.0000 mL | Freq: Once | OROMUCOSAL | Status: AC
Start: 2023-12-11 — End: 2023-12-11
  Administered 2023-12-11: 15 mL via ORAL
  Filled 2023-12-11: qty 15

## 2023-12-11 NOTE — Telephone Encounter (Signed)
 FYI Only or Action Required?: FYI only for provider: advised UC.  Patient was last seen in primary care on 08/07/2023 by Edman Marsa PARAS, DO.  Called Nurse Triage reporting Chest Pain.  Symptoms began yesterday.  Interventions attempted: Rest, hydration, or home remedies.  Symptoms are: unchanged.  Triage Disposition: See HCP Within 4 Hours (Or PCP Triage)  Patient/caregiver understands and will follow disposition?: Yes         Copied from CRM #[id number]. Topic: Clinical - Red Word Triage >> Dec 11, 2023  8:59 AM Tiffini S wrote: [facility] that prompted transfer to Nurse Triage: Patient called asking to schedule a EKG today for left breast- extremely painful Reason for Disposition  [1] Chest pain(s) lasting a few seconds AND [2] persists > 3 days    No access with PCP, triager strongly advised UC for further evaluation-- gave address/phone number for [facility]. Patient verbalized understanding and to call back if needed.  Answer Assessment - Initial Assessment Questions 1. LOCATION: Where does it hurt?       L CP under left breast - took antacid with some improvement 2. RADIATION: Does the pain go anywhere else? (e.g., into neck, jaw, arms, back)     denies 3. ONSET: When did the chest pain begin? (Minutes, hours or days)      yesterday 4. PATTERN: Does the pain come and go, or has it been constant since it started?  Does it get worse with exertion?      Comes and goes. Worse when coughing/inhaling, but able to exert without issues 5. DURATION: How long does it last (e.g., seconds, minutes, hours)     A few seconds 6. SEVERITY: How bad is the pain?  (e.g., Scale 1-10; mild, moderate, or severe)     7/10 during events  - self resolves after a few seconds 7. CARDIAC RISK FACTORS: Do you have any history of heart problems or risk factors for heart disease? (e.g., angina, prior heart attack; diabetes, high blood pressure, high cholesterol, smoker, or  strong family history of heart disease)     Endorses HTN (amlodipine ) - does not check BP daily, last checked last week-- triager asked pt to check during call -- 153/93 8. PULMONARY RISK FACTORS: Do you have any history of lung disease?  (e.g., blood clots in lung, asthma, emphysema, birth control pills)     denies 9. CAUSE: What do you think is causing the chest pain?     unknown 10. OTHER SYMPTOMS: Do you have any other symptoms? (e.g., dizziness, nausea, vomiting, sweating, fever, difficulty breathing, cough)       Denies. Endorses hot flashes d/t menopause 11. PREGNANCY: Is there any chance you are pregnant? When was your last menstrual period?       N/a  Protocols used: Chest Pain-A-AH

## 2023-12-11 NOTE — ED Provider Notes (Signed)
 [facility] Provider Note    Event Date/Time   First MD Initiated Contact with Patient 12/11/23 1127     (approximate)   History   Chest Pain   HPI  Caroline Wang is a 53 y.o. female history of hypertension, prediabetes presents emergency department complaining of left upper quadrant/epigastric pain.  Patient states that it hurts to cough, hurts to lay on the right side, hurts to lay on the left side, does not hurt to lay back, denies sweating or radiation of pain.  Pain is not constant.  States it is more with movement, cough, and pressure applied to the area.  Non-smoker, no hormone replacement, no recent travel, no leg pain.  Went to urgent care and they sent her here      Physical Exam   Triage Vital Signs: ED Triage Vitals  Encounter Vitals Group     BP 12/11/23 1036 (!) 150/82     Girls Systolic BP Percentile --      Girls Diastolic BP Percentile --      Boys Systolic BP Percentile --      Boys Diastolic BP Percentile --      Pulse Rate 12/11/23 1036 (!) 59     Resp 12/11/23 1036 18     Temp 12/11/23 1036 98 F (36.7 C)     Temp src --      SpO2 12/11/23 1036 100 %     Weight 12/11/23 1034 220 lb (99.8 kg)     Height 12/11/23 1034 5' 4 (1.626 m)     Head Circumference --      Peak Flow --      Pain Score --      Pain Loc --      Pain Education --      Exclude from Growth Chart --     Most recent vital signs: Vitals:   12/11/23 1036  BP: (!) 150/82  Pulse: (!) 59  Resp: 18  Temp: 98 F (36.7 C)  SpO2: 100%     General: Awake, no distress.   CV:  Good peripheral perfusion. regular rate and  rhythm Resp:  Normal effort. Lungs CTA Abd:  No distention.  Mildly tender left upper quadrant and epigastric Other:      ED Results / Procedures / Treatments   Labs (all labs ordered are listed, but only abnormal results are displayed) Labs Reviewed  BASIC METABOLIC PANEL WITH GFR  CBC  LIPASE, BLOOD  HEPATIC FUNCTION PANEL   TROPONIN T, HIGH SENSITIVITY  TROPONIN T, HIGH SENSITIVITY     EKG  EKG   RADIOLOGY Chest x-ray    PROCEDURES:   Procedures  Critical Care:  no Chief Complaint  Patient presents with   Chest Pain      MEDICATIONS ORDERED IN ED: Medications  alum & mag hydroxide-simeth (MAALOX/MYLANTA) 200-200-20 MG/5ML suspension 30 mL (30 mLs Oral Given 12/11/23 1155)    And  lidocaine  (XYLOCAINE ) 2 % viscous mouth solution 15 mL (15 mLs Oral Given 12/11/23 1154)     IMPRESSION / MDM / ASSESSMENT AND PLAN / ED COURSE  I reviewed the triage vital signs and the nursing notes.                              Differential diagnosis includes, but is not limited to, MI, NSTEMI, esophagitis, pancreatitis, choledocholithiasis, PE, CAP, muscle strain, costochondritis  Patient's presentation is  most consistent with acute illness / injury with system symptoms.   Medications given: GI cocktail  Wells criteria negative for PE  CBC metabolic panel and first troponin are all reassuring  Chest x-ray independent review interpretation by me as being negative for acute abnormality  EKG showed bradycardia, no STEMI, see physician read   With the cardiac workup being negative at this time, she is not tachycardic to indicate PE, will try GI cocktail to see if it alleviates some of the pain  Patient had mild relief with GI cocktail.  At this time I do not see any red flags to warrant further workup as all of her labs are reassuring, chest x-ray reassuring and she did have some relief with GI cocktail.  She does eat a lot of spicy food so I did caution her to take Protonix  if she eats spicy foods.  Tylenol  for pain if needed.  Return emergency department worsening.  See your regular doctor in 1 to 2 days if no improvement.  She is in agreement treatment plan.  Discharged stable condition. FINAL CLINICAL IMPRESSION(S) / ED DIAGNOSES   Final diagnoses:  Nonspecific chest pain  Esophagitis      Rx / DC Orders   ED Discharge Orders          Ordered    pantoprazole  (PROTONIX ) 40 MG tablet  Daily        12/11/23 1342             Note:  This document was prepared using Dragon voice recognition software and may include unintentional dictation errors.    Gasper Devere ORN, PA-C 12/11/23 1344    Waymond Lorelle Cummins, MD 12/11/23 [id number]

## 2023-12-11 NOTE — ED Triage Notes (Signed)
 Pt to ED for chest pain started yesterday, sent from [facility] for abnormal EKG. Denies shob, n/v. NAD noted.

## 2023-12-11 NOTE — ED Triage Notes (Signed)
 Patient to Urgent Care with complaints of chest pain under her left breast. Symptoms started around 11am yesterday. Sharp pain- no radiation.   Pain only w/ breathing/ coughing /sneezing.

## 2023-12-11 NOTE — ED Provider Notes (Signed)
 [facility]    CSN: [id number] Arrival date & time: 12/11/23  9056      History   Chief Complaint Chief Complaint  Patient presents with   Chest Pain    HPI Caroline Wang is a 53 y.o. female.  Patient presents with pain in her left chest under her left breast since yesterday morning.  The pain is sharp, 8/10, nonradiating, intermittent, occurs with deep breathing, coughing, sneezing, lying on right side.  The pain improves with lying on her back.  She does not currently have chest pain unless she takes a deep breath.  She took an antacid yesterday for her symptoms and believes it helped temporarily.  She denies shortness of breath, dizziness, numbness, weakness, vision change, nausea, vomiting.  Her medical history includes hypertension, morbid obesity, and prediabetes.  The history is provided by the patient and medical records.    Past Medical History:  Diagnosis Date   Headache    Hypertension     Patient Active Problem List   Diagnosis Date Noted   Essential hypertension 02/07/2023   Pre-diabetes 02/07/2023   Flat foot 06/04/2019   Bilateral bunions 06/04/2019   Chronic pain of both feet 06/04/2019   Elevated hemoglobin A1c 08/30/2017   Chronic migraine 08/30/2017   Morbid obesity (HCC) 05/03/2017   Chronic allergic rhinitis 06/10/2016    Past Surgical History:  Procedure Laterality Date   CESAREAN SECTION     CHOLECYSTECTOMY     COLONOSCOPY N/A 07/12/2021   Procedure: COLONOSCOPY;  Surgeon: Therisa Bi, MD;  Location: [facility] ENDOSCOPY;  Service: Gastroenterology;  Laterality: N/A;   INCISION AND DRAINAGE ABSCESS Right 04/08/2022   Procedure: INCISION AND DRAINAGE ABSCESS;  Surgeon: Rodolph Romano, MD;  Location: [facility] ORS;  Service: General;  Laterality: Right;   SHOULDER ARTHROSCOPY DISTAL CLAVICLE EXCISION AND OPEN ROTATOR CUFF REPAIR     TOTAL ABDOMINAL HYSTERECTOMY  02/2004   Total Abdominal including ovaries, cervix - [facility] Dr Teresa Raddle    OB History   No obstetric history on file.      Home Medications    Prior to Admission medications   Medication Sig Start Date End Date Taking? Authorizing Provider  amLODipine  (NORVASC ) 5 MG tablet Take 1 tablet (5 mg total) by mouth daily. 11/03/23  Yes Karamalegos, Marsa PARAS, DO  Secukinumab (COSENTYX [place]) Inject into the skin every 30 (thirty) days.   Yes [provider]  Vitamin D, Ergocalciferol, (DRISDOL) 1.25 MG (50000 UNIT) CAPS capsule Take 50,000 Units by mouth once a week.   Yes [provider]  clindamycin (CLEOCIN T) 1 % external solution Apply topically. 05/20/22   [provider]  triamcinolone  cream (KENALOG ) 0.1 % Apply 1 application. topically 2 (two) times daily. For 1-2 weeks as needed for flares 06/09/21   Edman Marsa PARAS, DO    Family History Family History  Problem Relation Age of Onset   Hypertension Mother    Hypertension Brother    Hypertension Maternal Grandmother    Other Maternal Aunt 59       Pre-Diabetes   Breast cancer Neg Hx    Diabetes Neg Hx    Colon cancer Neg Hx     Social History Social History   Tobacco Use   Smoking status: Never   Smokeless tobacco: Never  Vaping Use   Vaping status: Never Used  Substance Use Topics   Alcohol use: Yes    Alcohol/week: 0.0 standard drinks of alcohol  Drug use: No     Allergies   Patient has no known allergies.   Review of Systems Review of Systems  Respiratory:  Negative for cough and shortness of breath.   Cardiovascular:  Positive for chest pain. Negative for palpitations.  Gastrointestinal:  Negative for nausea and vomiting.  Neurological:  Negative for dizziness, syncope, facial asymmetry, speech difficulty, weakness, light-headedness, numbness and headaches.     Physical Exam Triage Vital Signs ED Triage Vitals  Encounter Vitals Group     BP 12/11/23 0956 134/84     Girls Systolic BP Percentile --      Girls  Diastolic BP Percentile --      Boys Systolic BP Percentile --      Boys Diastolic BP Percentile --      Pulse Rate 12/11/23 0956 63     Resp 12/11/23 0956 18     Temp 12/11/23 0956 98 F (36.7 C)     Temp src --      SpO2 12/11/23 0956 99 %     Weight --      Height --      Head Circumference --      Peak Flow --      Pain Score 12/11/23 1004 8     Pain Loc --      Pain Education --      Exclude from Growth Chart --    No data found.  Updated Vital Signs BP 134/84   Pulse 63   Temp 98 F (36.7 C)   Resp 18   SpO2 99%   Visual Acuity Right Eye Distance:   Left Eye Distance:   Bilateral Distance:    Right Eye Near:   Left Eye Near:    Bilateral Near:     Physical Exam Constitutional:      General: She is not in acute distress.    Appearance: She is obese.  HENT:     Mouth/Throat:     Mouth: Mucous membranes are moist.  Cardiovascular:     Rate and Rhythm: Normal rate and regular rhythm.     Heart sounds: Normal heart sounds.  Pulmonary:     Effort: Pulmonary effort is normal. No respiratory distress.     Breath sounds: Normal breath sounds.  Neurological:     General: No focal deficit present.     Mental Status: She is alert.     Sensory: No sensory deficit.     Motor: No weakness.     Gait: Gait normal.      UC Treatments / Results  Labs (all labs ordered are listed, but only abnormal results are displayed) Labs Reviewed - No data to display  EKG   Radiology No results found.  Procedures Procedures (including critical care time)  Medications Ordered in UC Medications - No data to display  Initial Impression / Assessment and Plan / UC Course  I have reviewed the triage vital signs and the nursing notes.  Pertinent labs & imaging results that were available during my care of the patient were reviewed by me and considered in my medical decision making (see chart for details).   Chest pain, abnormal EKG.  Afebrile and vital signs are  stable.  Discussed limitations of evaluation of her symptoms in an urgent care setting.  EKG shows sinus bradycardia, rate 55, inverted T waves in lead III and ST elevation in V2, no previous to compare.  Sending patient to the ED for evaluation.  She declines EMS and will go to [facility] ED now.  Final Clinical Impressions(s) / UC Diagnoses   Final diagnoses:  Chest pain, unspecified type  Abnormal EKG     Discharge Instructions      Go to the emergency department for evaluation of your chest pain and abnormal EKG.     ED Prescriptions   None    PDMP not reviewed this encounter.   Corlis Burnard DEL, NP 12/11/23 1024

## 2023-12-11 NOTE — Discharge Instructions (Signed)
 Follow-up with your regular doctor if not improving in 2 to 3 days.  Return emergency department if worsening.  Take the Protonix  as prescribed.

## 2023-12-11 NOTE — Discharge Instructions (Signed)
Go to the emergency department for evaluation of your chest pain and abnormal EKG.  ? ? ?

## 2023-12-11 NOTE — ED Notes (Signed)
 Patient is being discharged from the Urgent Care and sent to the Emergency Department via POV . Per Burnard Cork NP, patient is in need of higher level of care due to chest pain. Patient is aware and verbalizes understanding of plan of care.  Vitals:   12/11/23 0956  BP: 134/84  Pulse: 63  Resp: 18  Temp: 98 F (36.7 C)  SpO2: 99%

## 2024-02-07 ENCOUNTER — Ambulatory Visit: Payer: Self-pay | Admitting: Family Medicine

## 2024-02-08 ENCOUNTER — Other Ambulatory Visit

## 2024-02-14 ENCOUNTER — Encounter: Admitting: Family Medicine

## 2024-03-15 ENCOUNTER — Other Ambulatory Visit

## 2024-03-18 ENCOUNTER — Encounter: Admitting: Family Medicine
